# Patient Record
Sex: Female | Born: 1960 | Race: Black or African American | Hispanic: No | Marital: Married | Smoking: Never smoker
Health system: Southern US, Community
[De-identification: ages and names within clinical notes are randomized; demographics above are authoritative.]

## PROBLEM LIST (undated history)

## (undated) DIAGNOSIS — R1314 Dysphagia, pharyngoesophageal phase: Secondary | ICD-10-CM

## (undated) DIAGNOSIS — Z1211 Encounter for screening for malignant neoplasm of colon: Secondary | ICD-10-CM

## (undated) DIAGNOSIS — K639 Disease of intestine, unspecified: Secondary | ICD-10-CM

## (undated) DIAGNOSIS — K649 Unspecified hemorrhoids: Secondary | ICD-10-CM

## (undated) DIAGNOSIS — Z1379 Encounter for other screening for genetic and chromosomal anomalies: Secondary | ICD-10-CM

## (undated) DIAGNOSIS — Z1239 Encounter for other screening for malignant neoplasm of breast: Secondary | ICD-10-CM

## (undated) DIAGNOSIS — Z85038 Personal history of other malignant neoplasm of large intestine: Secondary | ICD-10-CM

## (undated) HISTORY — DX: Dysphagia, pharyngoesophageal phase: R13.14

## (undated) HISTORY — PX: COLONOSCOPY: SHX174

## (undated) HISTORY — DX: Personal history of other malignant neoplasm of large intestine: Z85.038

## (undated) HISTORY — DX: Disease of intestine, unspecified: K63.9

## (undated) HISTORY — DX: Unspecified hemorrhoids: K64.9

## (undated) HISTORY — DX: Encounter for other screening for genetic and chromosomal anomalies: Z13.79

## (undated) HISTORY — DX: Encounter for other screening for malignant neoplasm of breast: Z12.39

## (undated) HISTORY — DX: Encounter for screening for malignant neoplasm of colon: Z12.11

---

## 2003-04-27 DIAGNOSIS — K649 Unspecified hemorrhoids: Secondary | ICD-10-CM

## 2003-04-27 DIAGNOSIS — Z85038 Personal history of other malignant neoplasm of large intestine: Secondary | ICD-10-CM

## 2003-04-27 HISTORY — DX: Unspecified hemorrhoids: K64.9

## 2003-04-27 HISTORY — DX: Personal history of other malignant neoplasm of large intestine: Z85.038

## 2003-04-27 HISTORY — PX: COLON SURGERY: SHX602

## 2003-11-25 DIAGNOSIS — K639 Disease of intestine, unspecified: Secondary | ICD-10-CM

## 2003-11-25 HISTORY — DX: Disease of intestine, unspecified: K63.9

## 2004-02-11 ENCOUNTER — Ambulatory Visit: Payer: Self-pay

## 2005-04-13 ENCOUNTER — Ambulatory Visit: Payer: Self-pay

## 2005-11-16 ENCOUNTER — Ambulatory Visit: Payer: Self-pay | Admitting: Family Medicine

## 2006-05-03 ENCOUNTER — Ambulatory Visit: Payer: Self-pay

## 2006-05-05 ENCOUNTER — Ambulatory Visit: Payer: Self-pay

## 2006-12-08 ENCOUNTER — Ambulatory Visit: Payer: Self-pay | Admitting: General Surgery

## 2007-05-08 ENCOUNTER — Ambulatory Visit: Payer: Self-pay

## 2007-09-20 ENCOUNTER — Ambulatory Visit: Payer: Self-pay | Admitting: Family Medicine

## 2007-09-26 ENCOUNTER — Ambulatory Visit: Payer: Self-pay | Admitting: Family Medicine

## 2008-05-08 ENCOUNTER — Ambulatory Visit: Payer: Self-pay

## 2009-05-13 ENCOUNTER — Ambulatory Visit: Payer: Self-pay

## 2010-06-04 ENCOUNTER — Ambulatory Visit: Payer: Self-pay

## 2011-01-22 ENCOUNTER — Ambulatory Visit: Payer: Self-pay | Admitting: General Surgery

## 2011-01-25 LAB — PATHOLOGY REPORT

## 2011-04-27 DIAGNOSIS — R1314 Dysphagia, pharyngoesophageal phase: Secondary | ICD-10-CM

## 2011-04-27 HISTORY — DX: Dysphagia, pharyngoesophageal phase: R13.14

## 2011-07-07 ENCOUNTER — Ambulatory Visit: Payer: Self-pay

## 2012-02-28 ENCOUNTER — Ambulatory Visit: Payer: Self-pay | Admitting: General Surgery

## 2012-03-08 DIAGNOSIS — Z1239 Encounter for other screening for malignant neoplasm of breast: Secondary | ICD-10-CM

## 2012-03-08 DIAGNOSIS — Z1211 Encounter for screening for malignant neoplasm of colon: Secondary | ICD-10-CM

## 2012-03-08 HISTORY — DX: Encounter for screening for malignant neoplasm of colon: Z12.11

## 2012-03-08 HISTORY — DX: Encounter for other screening for malignant neoplasm of breast: Z12.39

## 2012-05-25 ENCOUNTER — Encounter: Payer: Self-pay | Admitting: *Deleted

## 2013-05-10 ENCOUNTER — Other Ambulatory Visit: Payer: Self-pay

## 2013-05-10 DIAGNOSIS — Z85038 Personal history of other malignant neoplasm of large intestine: Secondary | ICD-10-CM

## 2013-05-15 LAB — CEA: CEA: 3.1 ng/mL (ref 0.0–4.7)

## 2013-05-16 ENCOUNTER — Telehealth: Payer: Self-pay

## 2013-05-16 NOTE — Telephone Encounter (Signed)
Notified patient as instructed, patient pleased. Discussed follow-up appointments, patient agrees  

## 2013-05-16 NOTE — Telephone Encounter (Signed)
MSG left to call back for lab results.

## 2013-05-16 NOTE — Telephone Encounter (Signed)
Message copied by Lesly Rubenstein on Wed May 16, 2013  8:15 AM ------      Message from: [place], Naranja W      Created: Tue May 15, 2013  8:11 AM       Please notify the patient that her recent CEA showed a minimal change (2.8-3.1) from past years. We'll plan on a repeat in 6 months when it is time for repeat colonoscopy.       ----- Message -----         From: [facility] Lab Results In Interface         Sent: 05/15/2013   5:44 AM           To: Robert Bellow, MD                   ------

## 2013-06-12 ENCOUNTER — Ambulatory Visit: Payer: Self-pay | Admitting: General Surgery

## 2013-06-14 ENCOUNTER — Encounter: Payer: Self-pay | Admitting: General Surgery

## 2013-06-21 ENCOUNTER — Ambulatory Visit: Payer: Self-pay | Admitting: General Surgery

## 2013-07-04 ENCOUNTER — Encounter: Payer: Self-pay | Admitting: General Surgery

## 2013-07-04 ENCOUNTER — Ambulatory Visit (INDEPENDENT_AMBULATORY_CARE_PROVIDER_SITE_OTHER): Payer: BC Managed Care – PPO | Admitting: General Surgery

## 2013-07-04 VITALS — BP 148/82 | HR 70 | Resp 12 | Ht 65.0 in | Wt 149.0 lb

## 2013-07-04 DIAGNOSIS — Z85038 Personal history of other malignant neoplasm of large intestine: Secondary | ICD-10-CM

## 2013-07-04 NOTE — Patient Instructions (Signed)
The patient is aware to call back for any questions or concerns.  

## 2013-07-04 NOTE — Progress Notes (Signed)
Patient ID: Dawn Holt, female   DOB: 06-06-1960, 53 y.o.   MRN: [id number]  Chief Complaint  Patient presents with  . Follow-up    HPI Dawn Holt is a 53 y.o. female.  Here today for follow up colon cancer. Denies any gastrointestinal issues.  CEA was done in January 2015. No trouble swallowing at this time, she takes the omeprazole as needed, rarely twice a week a at this time. Bowels generally move daily.  HPI  Past Medical History  Diagnosis Date  . Hemorrhoids 2005  . Dysphagia, pharyngoesophageal phase 2013  . Breast screening, unspecified 03/08/2012  . Special screening for malignant neoplasms, colon 03/08/2012  . Personal history of malignant neoplasm of large intestine 2005  . Bowel trouble 11/2003    Past Surgical History  Procedure Laterality Date  . Colon surgery  2005    colon resection  . Colonoscopy  2005,2008,2012    Family History  Problem Relation Age of Onset  . Colon polyps Mother   . Colon polyps Maternal Aunt     with cancer    Social History History  Substance Use Topics  . Smoking status: Never Smoker   . Smokeless tobacco: Never Used  . Alcohol Use: Yes     Comment: occasionally    No Known Allergies  Current Outpatient Prescriptions  Medication Sig Dispense Refill  . omeprazole (PRILOSEC) 20 MG capsule Take 20 mg by mouth as needed.      . zolpidem (AMBIEN) 10 MG tablet Take 10 mg by mouth at bedtime as needed.        No current facility-administered medications for this visit.    Review of Systems Review of Systems  Constitutional: Negative.   Respiratory: Negative.   Cardiovascular: Negative.     Blood pressure 148/82, pulse 70, resp. rate 12, height 5\' 5"  (1.651 m), weight 149 lb (67.586 kg).  Physical Exam Physical Exam  Constitutional: She is oriented to person, place, and time. She appears well-developed and well-nourished.  Neck: Neck supple.  Cardiovascular: Normal rate, regular rhythm and normal heart sounds.    Pulmonary/Chest: Effort normal and breath sounds normal.  Abdominal: Soft. Normal appearance. There is no tenderness. No hernia.  Lymphadenopathy:    She has no cervical adenopathy.  Neurological: She is alert and oriented to person, place, and time.  Skin: Skin is warm and dry.    Data Reviewed A recently completed CEA: 3.1 ng/milliliter. Previously 2.8 ng/milliliter.  Assessment    Minimal change in her CEA, similar to past episodes.     Plan    Repeat CEA in July 2015. Repeat colonoscopy 2017. Follow up in the office in one year.  As the patient has a family history of colonic polyps in her mother and colon cancer in her maternal and, we'll see if she is a candidate for genetic testing.       Robert Bellow 07/07/2013, 3:25 PM

## 2013-07-07 DIAGNOSIS — Z85038 Personal history of other malignant neoplasm of large intestine: Secondary | ICD-10-CM | POA: Insufficient documentation

## 2013-07-10 ENCOUNTER — Telehealth: Payer: Self-pay | Admitting: *Deleted

## 2013-07-10 NOTE — Telephone Encounter (Signed)
Message copied by Carson Myrtle on Tue Jul 10, 2013 10:26 AM ------      Message from: Robert Bellow      Created: Sat Jul 07, 2013  3:27 PM       Contact Shara Blazing at the cancer center and see if this patient would be a candidate for genetic testing with her on personal history of colon cancer at age 53 as well as maternal history of colon polyps and a maternal aunt with colon cancer. Thank you ------

## 2013-07-12 ENCOUNTER — Encounter: Payer: Self-pay | Admitting: General Surgery

## 2013-07-12 NOTE — Telephone Encounter (Signed)
Patient qualifies for genetic testing based on criteria (Crystal at [facility]).

## 2013-07-19 ENCOUNTER — Encounter: Payer: Self-pay | Admitting: *Deleted

## 2013-07-19 ENCOUNTER — Telehealth: Payer: Self-pay | Admitting: *Deleted

## 2013-07-19 NOTE — Telephone Encounter (Signed)
Message copied by Carson Myrtle on Thu Jul 19, 2013  1:14 PM ------      Message from: [place], [place] W      Created: Thu Jul 19, 2013 12:40 PM       Let her get the [facility] assessment run. Thanks.      ----- Message -----         From: Carson Myrtle, RN         Sent: 07/12/2013   4:10 PM           To: Robert Bellow, MD            Desma Maxim sent me a message about genetic testing and Hansel Starling says she will quality for testing based on her age at [facility].      Thanks       ------

## 2013-07-19 NOTE — Telephone Encounter (Signed)
appt for genetic testing arranged

## 2013-07-26 ENCOUNTER — Ambulatory Visit: Payer: BC Managed Care – PPO | Admitting: *Deleted

## 2013-07-26 DIAGNOSIS — Z85038 Personal history of other malignant neoplasm of large intestine: Secondary | ICD-10-CM

## 2013-07-27 DIAGNOSIS — Z1379 Encounter for other screening for genetic and chromosomal anomalies: Secondary | ICD-10-CM

## 2013-07-27 HISTORY — DX: Encounter for other screening for genetic and chromosomal anomalies: Z13.79

## 2013-07-31 NOTE — Progress Notes (Signed)
Genetic testing completed. 

## 2013-08-01 ENCOUNTER — Telehealth: Payer: Self-pay | Admitting: *Deleted

## 2013-08-01 NOTE — Telephone Encounter (Signed)
Need information for genetic testing

## 2013-08-06 ENCOUNTER — Telehealth: Payer: Self-pay | Admitting: General Surgery

## 2013-08-06 NOTE — Telephone Encounter (Signed)
08-06-13 PT CALLED IN TODAY.SHE HAD RECEIVED MARSHA HATCH RN'SCALL FROM 08-01-13.SHE STATES SHE WANTS TO  CANCEL THE GENERIC TESTING AND JUST COME AT HER NEXT [facility] APPT.(IN RECALL)/MTH

## 2013-08-06 NOTE — Telephone Encounter (Signed)
PATTY WITH [facility] CALLED REGARDING PATIENT.NEEDS FOR YOU TO RETURN HER CALL @ [phone]/MTH °

## 2013-08-14 ENCOUNTER — Telehealth: Payer: Self-pay | Admitting: *Deleted

## 2013-08-14 NOTE — Telephone Encounter (Signed)
Message copied by Carson Myrtle on Tue Aug 14, 2013  4:22 PM ------      Message from: Robert Bellow      Created: Tue Aug 14, 2013  4:18 PM       Please notify the patient that her genetic testing came back all clear. Thanks. ------

## 2013-08-15 NOTE — Telephone Encounter (Signed)
Notified patient as instructed, patient pleased. Discussed follow-up appointments, patient agrees  

## 2013-09-05 ENCOUNTER — Encounter: Payer: Self-pay | Admitting: General Surgery

## 2013-10-25 ENCOUNTER — Other Ambulatory Visit: Payer: Self-pay

## 2013-10-25 DIAGNOSIS — Z85038 Personal history of other malignant neoplasm of large intestine: Secondary | ICD-10-CM

## 2013-11-03 ENCOUNTER — Telehealth: Payer: Self-pay | Admitting: General Surgery

## 2013-11-03 LAB — CEA: CEA: 2.8 ng/mL (ref 0.0–4.7)

## 2013-11-03 NOTE — Telephone Encounter (Signed)
CEA back to prior value of 2.8. We'll plan for followup exam in one year.

## 2014-02-25 ENCOUNTER — Encounter: Payer: Self-pay | Admitting: *Deleted

## 2014-06-06 ENCOUNTER — Other Ambulatory Visit: Payer: Self-pay

## 2014-06-06 DIAGNOSIS — Z85038 Personal history of other malignant neoplasm of large intestine: Secondary | ICD-10-CM

## 2014-06-18 ENCOUNTER — Telehealth: Payer: Self-pay | Admitting: *Deleted

## 2014-06-18 LAB — CEA: CEA: 2.8 ng/mL (ref 0.0–4.7)

## 2014-06-18 NOTE — Telephone Encounter (Signed)
Notified patient as instructed, patient pleased. Discussed follow-up appointments, patient agrees  

## 2014-06-18 NOTE — Telephone Encounter (Signed)
-----   Message from Robert Bellow, MD sent at 06/18/2014  3:41 PM EST ----- Regarding:   Please notify the patient recent CEA stable. F/U as planned in March.  ----- Message -----    From: [facility] Lab Results In Interface    Sent: 06/18/2014   7:41 AM      To: Robert Bellow, MD

## 2014-07-04 ENCOUNTER — Encounter: Payer: Self-pay | Admitting: General Surgery

## 2014-07-04 ENCOUNTER — Ambulatory Visit (INDEPENDENT_AMBULATORY_CARE_PROVIDER_SITE_OTHER): Payer: BC Managed Care – PPO | Admitting: General Surgery

## 2014-07-04 VITALS — BP 160/80 | HR 74 | Resp 12 | Ht 65.5 in | Wt 162.0 lb

## 2014-07-04 DIAGNOSIS — Z85038 Personal history of other malignant neoplasm of large intestine: Secondary | ICD-10-CM | POA: Diagnosis not present

## 2014-07-04 NOTE — Patient Instructions (Signed)
Patient to return in 1 year for follow up. The patient is aware to call back for any questions or concerns.

## 2014-07-04 NOTE — Progress Notes (Signed)
Patient ID: Dawn Holt, female   DOB: 05-02-1960, 54 y.o.   MRN: [id number]  Chief Complaint  Patient presents with  . Follow-up    colon cancer    HPI Dawn Holt is a 54 y.o. female who presents for a follow up for colon cancer. The patient is doing well. No new complaints at this time. She is due next year for a colonoscopy.    HPI  Past Medical History  Diagnosis Date  . Hemorrhoids 2005  . Dysphagia, pharyngoesophageal phase 2013  . Breast screening, unspecified 03/08/2012  . Special screening for malignant neoplasms, colon 03/08/2012  . Personal history of malignant neoplasm of large intestine 2005  . Bowel trouble 11/2003    Past Surgical History  Procedure Laterality Date  . Colon surgery  2005    colon resection  . Colonoscopy  2005,2008,2012    Family History  Problem Relation Age of Onset  . Colon polyps Mother   . Colon polyps Maternal Aunt 62    with cancer  . Colon polyps Maternal Aunt 65    with cancer  . Colon polyps Brother 55    Social History History  Substance Use Topics  . Smoking status: Never Smoker   . Smokeless tobacco: Never Used  . Alcohol Use: Yes     Comment: occasionally    No Known Allergies  Current Outpatient Prescriptions  Medication Sig Dispense Refill  . omeprazole (PRILOSEC) 20 MG capsule Take 20 mg by mouth as needed.    . zolpidem (AMBIEN) 10 MG tablet Take 10 mg by mouth at bedtime as needed.      No current facility-administered medications for this visit.    Review of Systems Review of Systems  Constitutional: Negative.   Respiratory: Negative.   Cardiovascular: Negative.   Gastrointestinal: Negative.     Blood pressure 160/80, pulse 74, resp. rate 12, height 5' 5.5" (1.664 m), weight 162 lb (73.483 kg).  Physical Exam Physical Exam  Constitutional: She is oriented to person, place, and time. She appears well-developed and well-nourished.  Cardiovascular: Normal rate, regular rhythm and normal  heart sounds.   No murmur heard. Pulmonary/Chest: Effort normal and breath sounds normal.  Neurological: She is alert and oriented to person, place, and time.  Skin: Skin is warm and dry.    Data Reviewed Recent CEA is stable at 2.8.  Assessment    Doing well 11 years status post colon resection.    Plan    We'll plan for a follow-up CEA in 2017. She will be due for a repeat colonoscopy at that time.     PCP:  Mamie Laurel 07/06/2014, 9:14 AM

## 2014-07-06 DIAGNOSIS — Z85038 Personal history of other malignant neoplasm of large intestine: Secondary | ICD-10-CM | POA: Insufficient documentation

## 2014-11-20 ENCOUNTER — Encounter: Payer: Self-pay | Admitting: Family Medicine

## 2014-11-20 ENCOUNTER — Encounter (INDEPENDENT_AMBULATORY_CARE_PROVIDER_SITE_OTHER): Payer: Self-pay

## 2014-11-20 ENCOUNTER — Ambulatory Visit (INDEPENDENT_AMBULATORY_CARE_PROVIDER_SITE_OTHER): Payer: BC Managed Care – PPO | Admitting: Family Medicine

## 2014-11-20 VITALS — BP 136/64 | HR 86 | Temp 98.4°F | Resp 14 | Ht 66.0 in | Wt 162.1 lb

## 2014-11-20 DIAGNOSIS — Z780 Asymptomatic menopausal state: Secondary | ICD-10-CM | POA: Diagnosis not present

## 2014-11-20 DIAGNOSIS — Z0000 Encounter for general adult medical examination without abnormal findings: Secondary | ICD-10-CM | POA: Diagnosis not present

## 2014-11-20 NOTE — Progress Notes (Signed)
Name: Dawn Holt   MRN: [id number]    DOB: 1961-01-12   Date:11/20/2014       Progress Note  Subjective  Chief Complaint  Chief Complaint  Patient presents with  . Annual Exam    HPI  54 year old   female here for annual H&P. No new problems have emerged.   Past Medical History  Diagnosis Date  . Hemorrhoids 2005  . Dysphagia, pharyngoesophageal phase 2013  . Breast screening, unspecified 03/08/2012  . Special screening for malignant neoplasms, colon 03/08/2012  . Personal history of malignant neoplasm of large intestine 2005  . Bowel trouble 11/2003    History  Substance Use Topics  . Smoking status: Never Smoker   . Smokeless tobacco: Never Used  . Alcohol Use: 0.0 oz/week    0 Standard drinks or equivalent per week     Comment: occasionally     Current outpatient prescriptions:  .  omeprazole (PRILOSEC) 20 MG capsule, Take 20 mg by mouth as needed., Disp: , Rfl:  .  zolpidem (AMBIEN) 10 MG tablet, Take 10 mg by mouth at bedtime as needed. , Disp: , Rfl:   No Known Allergies  Review of Systems  Constitutional: Positive for malaise/fatigue. Negative for fever, chills and weight loss.  HENT: Negative for congestion, hearing loss, sore throat and tinnitus.   Eyes: Negative for blurred vision, double vision and redness.  Respiratory: Negative for cough, hemoptysis and shortness of breath.   Cardiovascular: Negative for chest pain, palpitations, orthopnea, claudication and leg swelling.  Gastrointestinal: Negative for heartburn, nausea, vomiting, diarrhea, constipation and blood in stool.  Genitourinary: Negative for dysuria, urgency, frequency and hematuria.  Musculoskeletal: Negative for myalgias, back pain, joint pain, falls and neck pain.  Skin: Negative for itching.  Neurological: Negative for dizziness, tingling, tremors, focal weakness, seizures, loss of consciousness, weakness and headaches.  Endo/Heme/Allergies: Does not bruise/bleed easily.   Psychiatric/Behavioral: Negative for depression and substance abuse. The patient is not nervous/anxious and does not have insomnia.      Objective  Filed Vitals:   11/20/14 0916  BP: 136/64  Pulse: 86  Temp: 98.4 F (36.9 C)  TempSrc: Oral  Resp: 14  Height: 5\' 6"  (1.676 m)  Weight: 162 lb 1.6 oz (73.528 kg)  SpO2: 98%     Physical Exam  Constitutional: She is oriented to person, place, and time and well-developed, well-nourished, and in no distress.  HENT:  Head: Normocephalic.  Eyes: EOM are normal. Pupils are equal, round, and reactive to light.  Neck: Normal range of motion. No thyromegaly present.  Cardiovascular: Normal rate, regular rhythm and normal heart sounds.   No murmur heard. Pulmonary/Chest: Effort normal and breath sounds normal.  Abdominal: Soft. Bowel sounds are normal.  Genitourinary:    Members per  GYN   Musculoskeletal: Normal range of motion. She exhibits no edema.  Neurological: She is alert and oriented to person, place, and time. No cranial nerve deficit. Gait normal.  Skin: Skin is warm and dry. No rash noted.  Psychiatric: Memory and affect normal.      Assessment & Plan  1. Annual physical exam  - CBC w/Diff - COMPLETE METABOLIC PANEL WITH GFR - Lipid Profile - TSH - POC Hemoccult Bld/Stl (3-Cd Home Screen); Future - DG Bone Density; Future  2. Post-menopausal  - DG Bone Density; Future

## 2014-11-21 DIAGNOSIS — Z0000 Encounter for general adult medical examination without abnormal findings: Secondary | ICD-10-CM | POA: Insufficient documentation

## 2014-11-21 DIAGNOSIS — Z780 Asymptomatic menopausal state: Secondary | ICD-10-CM | POA: Insufficient documentation

## 2014-11-21 LAB — LIPID PANEL
CHOL/HDL RATIO: 2.9 ratio (ref 0.0–4.4)
CHOLESTEROL TOTAL: 239 mg/dL — AB (ref 100–199)
HDL: 82 mg/dL (ref >39)
LDL Calculated: 143 mg/dL — ABNORMAL HIGH (ref 0–99)
TRIGLYCERIDES: 72 mg/dL (ref 0–149)
VLDL CHOLESTEROL CAL: 14 mg/dL (ref 5–40)

## 2014-11-21 LAB — CBC WITH DIFFERENTIAL/PLATELET
BASOS ABS: 0 10*3/uL (ref 0.0–0.2)
BASOS: 0 %
EOS (ABSOLUTE): 0.1 10*3/uL (ref 0.0–0.4)
Eos: 2 %
HEMATOCRIT: 39.8 % (ref 34.0–46.6)
HEMOGLOBIN: 13.2 g/dL (ref 11.1–15.9)
Immature Grans (Abs): 0 10*3/uL (ref 0.0–0.1)
Immature Granulocytes: 0 %
Lymphocytes Absolute: 2.3 10*3/uL (ref 0.7–3.1)
Lymphs: 37 %
MCH: 31.4 pg (ref 26.6–33.0)
MCHC: 33.2 g/dL (ref 31.5–35.7)
MCV: 95 fL (ref 79–97)
MONOCYTES: 8 %
MONOS ABS: 0.5 10*3/uL (ref 0.1–0.9)
NEUTROS ABS: 3.3 10*3/uL (ref 1.4–7.0)
NEUTROS PCT: 53 %
PLATELETS: 305 10*3/uL (ref 150–379)
RBC: 4.21 x10E6/uL (ref 3.77–5.28)
RDW: 13.7 % (ref 12.3–15.4)
WBC: 6.1 10*3/uL (ref 3.4–10.8)

## 2014-11-21 LAB — COMPREHENSIVE METABOLIC PANEL
ALK PHOS: 92 IU/L (ref 39–117)
ALT: 15 IU/L (ref 0–32)
AST: 20 IU/L (ref 0–40)
Albumin/Globulin Ratio: 1.6 (ref 1.1–2.5)
Albumin: 4.7 g/dL (ref 3.5–5.5)
BUN / CREAT RATIO: 12 (ref 9–23)
BUN: 12 mg/dL (ref 6–24)
Bilirubin Total: 0.6 mg/dL (ref 0.0–1.2)
CHLORIDE: 102 mmol/L (ref 97–108)
CO2: 24 mmol/L (ref 18–29)
CREATININE: 1 mg/dL (ref 0.57–1.00)
Calcium: 9.8 mg/dL (ref 8.7–10.2)
GFR calc non Af Amer: 63 mL/min/{1.73_m2} (ref >59)
GFR, EST AFRICAN AMERICAN: 73 mL/min/{1.73_m2} (ref >59)
GLOBULIN, TOTAL: 3 g/dL (ref 1.5–4.5)
GLUCOSE: 107 mg/dL — AB (ref 65–99)
Potassium: 5.1 mmol/L (ref 3.5–5.2)
Sodium: 142 mmol/L (ref 134–144)
TOTAL PROTEIN: 7.7 g/dL (ref 6.0–8.5)

## 2014-11-21 LAB — TSH: TSH: 2.33 u[IU]/mL (ref 0.450–4.500)

## 2015-03-23 ENCOUNTER — Other Ambulatory Visit: Payer: Self-pay | Admitting: Family Medicine

## 2015-03-27 ENCOUNTER — Ambulatory Visit: Payer: BC Managed Care – PPO | Admitting: Family Medicine

## 2015-05-14 ENCOUNTER — Other Ambulatory Visit: Payer: Self-pay | Admitting: *Deleted

## 2015-05-14 ENCOUNTER — Encounter: Payer: Self-pay | Admitting: *Deleted

## 2015-05-14 DIAGNOSIS — Z85038 Personal history of other malignant neoplasm of large intestine: Secondary | ICD-10-CM

## 2015-05-14 NOTE — Addendum Note (Signed)
Addended by: Verlene Mayer A on: 05/14/2015 10:07 AM   Modules accepted: Orders

## 2015-05-21 ENCOUNTER — Telehealth: Payer: Self-pay

## 2015-05-21 LAB — CEA: CEA: 2.9 ng/mL (ref 0.0–4.7)

## 2015-05-21 NOTE — Telephone Encounter (Signed)
Notified patient as instructed, patient pleased. Discussed follow-up appointments, patient agrees  

## 2015-05-21 NOTE — Telephone Encounter (Signed)
-----   Message from Robert Bellow, MD sent at 05/21/2015  7:44 AM EST ----- Please notify the patient her lab work is fine. Follow-up as previously scheduled. ----- Message -----    From: [facility] Lab Results In Interface    Sent: 05/21/2015   5:37 AM      To: Robert Bellow, MD

## 2015-07-08 ENCOUNTER — Ambulatory Visit: Payer: Self-pay | Admitting: General Surgery

## 2015-07-17 ENCOUNTER — Ambulatory Visit (INDEPENDENT_AMBULATORY_CARE_PROVIDER_SITE_OTHER): Payer: BC Managed Care – PPO | Admitting: General Surgery

## 2015-07-17 ENCOUNTER — Encounter: Payer: Self-pay | Admitting: General Surgery

## 2015-07-17 VITALS — BP 178/88 | HR 76 | Resp 14 | Ht 66.0 in | Wt 164.0 lb

## 2015-07-17 DIAGNOSIS — Z85038 Personal history of other malignant neoplasm of large intestine: Secondary | ICD-10-CM | POA: Diagnosis not present

## 2015-07-17 DIAGNOSIS — G479 Sleep disorder, unspecified: Secondary | ICD-10-CM

## 2015-07-17 MED ORDER — ZOLPIDEM TARTRATE 10 MG PO TABS
10.0000 mg | ORAL_TABLET | Freq: Every evening | ORAL | Status: DC | PRN
Start: 2015-07-17 — End: 2015-11-05

## 2015-07-17 NOTE — Progress Notes (Signed)
Patient ID: Dawn Holt, female   DOB: [birth year], 55 y.o.   MRN: [id number]  Chief Complaint  Patient presents with  . Follow-up    colon cancer    HPI Dawn Holt is a 55 y.o. female here today for her follow up colon cancer. Patient state she is doing well.  HPI  Past Medical History  Diagnosis Date  . Hemorrhoids 2005  . Dysphagia, pharyngoesophageal phase 2013  . Breast screening, unspecified 03/08/2012  . Special screening for malignant neoplasms, colon 03/08/2012  . Personal history of malignant neoplasm of large intestine 2005  . Bowel trouble 11/2003    Past Surgical History  Procedure Laterality Date  . Colon surgery  2005    colon resection  . Colonoscopy  2005,2008,2012    Family History  Problem Relation Age of Onset  . Colon polyps Mother   . Colon polyps Maternal Aunt 62    with cancer  . Colon polyps Maternal Aunt 65    with cancer  . Colon polyps Brother 48    Social History Social History  Substance Use Topics  . Smoking status: Never Smoker   . Smokeless tobacco: Never Used  . Alcohol Use: 0.0 oz/week    0 Standard drinks or equivalent per week     Comment: occasionally    No Known Allergies  Current Outpatient Prescriptions  Medication Sig Dispense Refill  . omeprazole (PRILOSEC) 20 MG capsule Take 20 mg by mouth as needed.    . zolpidem (AMBIEN) 10 MG tablet Take 1 tablet (10 mg total) by mouth at bedtime as needed. 30 tablet 1   No current facility-administered medications for this visit.    Review of Systems Review of Systems  Constitutional: Negative.   Respiratory: Negative.   Cardiovascular: Negative.   Gastrointestinal: Negative.     Blood pressure 178/88, pulse 76, resp. rate 14, height 5\' 6"  (1.676 m), weight 164 lb (74.39 kg).  Physical Exam Physical Exam  Constitutional: She is oriented to person, place, and time. She appears well-developed and well-nourished.  Eyes: Conjunctivae are normal. No scleral  icterus.  Neck: Neck supple.  Cardiovascular: Normal rate, regular rhythm and normal heart sounds.   Pulmonary/Chest: Effort normal and breath sounds normal.  Abdominal: Bowel sounds are normal. There is no tenderness.  Lymphadenopathy:    She has no cervical adenopathy.  Neurological: She is alert and oriented to person, place, and time.  Skin: Skin is warm and dry.    Data Reviewed 01/22/2011 colonoscopy showed a 7 mm tubular adenoma in the cecum.  Assessment    Doing well now 12 years status post resection of the distal transverse colon.    Plan         Colonoscopy with possible biopsy/polypectomy prn: Information regarding the procedure, including its potential risks and complications (including but not limited to perforation of the bowel, which may require emergency surgery to repair, and bleeding) was verbally given to the patient. Educational information regarding lower intestinal endoscopy was given to the patient. Written instructions for how to complete the bowel prep using Miralax were provided. The importance of drinking ample fluids to avoid dehydration as a result of the prep emphasized.  Patient will be contacted when September 2017 schedule is available to arrange a date for colonoscopy. Miralax prescription will be sent in once date arranged. Pre-op visit will not be required. History and physical will be updated the day of colonoscopy.   The patient has had intermittent difficulties  with sleep and had been provided with small quantities of Ambien by her PCP. She has had difficulty following up as his medical health has interfered with his office hours. For that reason a small prescription of Ambien was provided to the patient.  Return to the office in one year for follow up with a CEA prior.  PCP:  Rutherford Nail  This information has been scribed by Gaspar Cola CMA.    Robert Bellow 07/18/2015, 2:12 PM

## 2015-07-17 NOTE — Patient Instructions (Signed)
Colonoscopy A colonoscopy is an exam to look at the entire large intestine (colon). This exam can help find problems such as tumors, polyps, inflammation, and areas of bleeding. The exam takes about 1 hour.  LET YOUR HEALTH CARE PROVIDER KNOW ABOUT:   Any allergies you have.  All medicines you are taking, including vitamins, herbs, eye drops, creams, and over-the-counter medicines.  Previous problems you or members of your family have had with the use of anesthetics.  Any blood disorders you have.  Previous surgeries you have had.  Medical conditions you have. RISKS AND COMPLICATIONS  Generally, this is a safe procedure. However, as with any procedure, complications can occur. Possible complications include:  Bleeding.  Tearing or rupture of the colon wall.  Reaction to medicines given during the exam.  Infection (rare). BEFORE THE PROCEDURE   Ask your health care provider about changing or stopping your regular medicines.  You may be prescribed an oral bowel prep. This involves drinking a large amount of medicated liquid, starting the day before your procedure. The liquid will cause you to have multiple loose stools until your stool is almost clear or light green. This cleans out your colon in preparation for the procedure.  Do not eat or drink anything else once you have started the bowel prep, unless your health care provider tells you it is safe to do so.  Arrange for someone to drive you home after the procedure. PROCEDURE   You will be given medicine to help you relax (sedative).  You will lie on your side with your knees bent.  A long, flexible tube with a light and camera on the end (colonoscope) will be inserted through the rectum and into the colon. The camera sends video back to a computer screen as it moves through the colon. The colonoscope also releases carbon dioxide gas to inflate the colon. This helps your health care provider see the area better.  During  the exam, your health care provider may take a small tissue sample (biopsy) to be examined under a microscope if any abnormalities are found.  The exam is finished when the entire colon has been viewed. AFTER THE PROCEDURE   Do not drive for 24 hours after the exam.  You may have a small amount of blood in your stool.  You may pass moderate amounts of gas and have mild abdominal cramping or bloating. This is caused by the gas used to inflate your colon during the exam.  Ask when your test results will be ready and how you will get your results. Make sure you get your test results.   This information is not intended to replace advice given to you by your health care provider. Make sure you discuss any questions you have with your health care provider.   Document Released: 04/09/2000 Document Revised: 01/31/2013 Document Reviewed: 12/18/2012 Elsevier Interactive Patient Education 2016 Elsevier Inc.  

## 2015-07-18 DIAGNOSIS — G479 Sleep disorder, unspecified: Secondary | ICD-10-CM

## 2015-07-18 HISTORY — DX: Sleep disorder, unspecified: G47.9

## 2015-08-29 LAB — HM PAP SMEAR: HM Pap smear: NORMAL

## 2015-09-19 ENCOUNTER — Encounter: Payer: Self-pay | Admitting: Family Medicine

## 2015-09-23 ENCOUNTER — Telehealth: Payer: Self-pay | Admitting: *Deleted

## 2015-09-23 MED ORDER — POLYETHYLENE GLYCOL 3350 17 GM/SCOOP PO POWD
1.0000 | Freq: Once | ORAL | Status: DC
Start: 2015-09-23 — End: 2015-11-05

## 2015-09-23 NOTE — Telephone Encounter (Signed)
Patient called and wanted to know when she can schedule colonoscopy.

## 2015-09-23 NOTE — Telephone Encounter (Signed)
Patient called and wanted to schedule her colonoscopy early. The patient is scheduled for a Colonoscopy at [facility] on 11/05/15. They are aware to call the day before to get their arrival time. Miralax prescription has been sent into the patient's pharmacy. The patient is aware of date and instructions.

## 2015-10-22 ENCOUNTER — Other Ambulatory Visit: Payer: Self-pay | Admitting: General Surgery

## 2015-10-22 DIAGNOSIS — Z85038 Personal history of other malignant neoplasm of large intestine: Secondary | ICD-10-CM

## 2015-11-03 ENCOUNTER — Telehealth: Payer: Self-pay | Admitting: *Deleted

## 2015-11-03 NOTE — Telephone Encounter (Signed)
Patient contacted today and confirms no medication changes since last office visit. Also, reports that she has Miralax prescription.  We will proceed with colonoscopy as scheduled for 11-05-15 at [facility].   This patient was instructed to call the office should she have further questions.

## 2015-11-05 ENCOUNTER — Ambulatory Visit: Payer: BC Managed Care – PPO | Admitting: Certified Registered"

## 2015-11-05 ENCOUNTER — Encounter: Payer: Self-pay | Admitting: *Deleted

## 2015-11-05 ENCOUNTER — Encounter: Payer: Self-pay | Admitting: Family Medicine

## 2015-11-05 ENCOUNTER — Encounter
Admission: RE | Disposition: A | Discharge status: Home / Self Care | Payer: Self-pay | Source: Ambulatory Visit | Attending: General Surgery

## 2015-11-05 ENCOUNTER — Ambulatory Visit (INDEPENDENT_AMBULATORY_CARE_PROVIDER_SITE_OTHER): Payer: BC Managed Care – PPO | Admitting: Family Medicine

## 2015-11-05 ENCOUNTER — Ambulatory Visit
Admission: RE | Admit: 2015-11-05 | Discharge: 2015-11-05 | Disposition: A | Discharge status: Home / Self Care | Payer: BC Managed Care – PPO | Source: Ambulatory Visit | Attending: General Surgery | Admitting: General Surgery

## 2015-11-05 VITALS — BP 157/80 | HR 67 | Temp 98.5°F | Resp 15 | Ht 66.0 in | Wt 158.9 lb

## 2015-11-05 DIAGNOSIS — R12 Heartburn: Secondary | ICD-10-CM

## 2015-11-05 DIAGNOSIS — Z9049 Acquired absence of other specified parts of digestive tract: Secondary | ICD-10-CM | POA: Diagnosis not present

## 2015-11-05 DIAGNOSIS — D125 Benign neoplasm of sigmoid colon: Secondary | ICD-10-CM | POA: Insufficient documentation

## 2015-11-05 DIAGNOSIS — Z85038 Personal history of other malignant neoplasm of large intestine: Secondary | ICD-10-CM | POA: Insufficient documentation

## 2015-11-05 DIAGNOSIS — Z9889 Other specified postprocedural states: Secondary | ICD-10-CM | POA: Diagnosis not present

## 2015-11-05 DIAGNOSIS — Z1211 Encounter for screening for malignant neoplasm of colon: Secondary | ICD-10-CM | POA: Diagnosis not present

## 2015-11-05 DIAGNOSIS — G479 Sleep disorder, unspecified: Secondary | ICD-10-CM

## 2015-11-05 DIAGNOSIS — Z79899 Other long term (current) drug therapy: Secondary | ICD-10-CM | POA: Insufficient documentation

## 2015-11-05 HISTORY — DX: Heartburn: R12

## 2015-11-05 HISTORY — PX: COLONOSCOPY WITH PROPOFOL: SHX5780

## 2015-11-05 SURGERY — COLONOSCOPY WITH PROPOFOL
Anesthesia: General

## 2015-11-05 MED ORDER — PROPOFOL 10 MG/ML IV BOLUS
INTRAVENOUS | Status: DC | PRN
  Administered 2015-11-05: 60 mg via INTRAVENOUS

## 2015-11-05 MED ORDER — OMEPRAZOLE 20 MG PO CPDR: 20.0000 mg | DELAYED_RELEASE_CAPSULE | Freq: Every day | ORAL | Status: DC

## 2015-11-05 MED ORDER — LIDOCAINE HCL (CARDIAC) 20 MG/ML IV SOLN
INTRAVENOUS | Status: DC | PRN
  Administered 2015-11-05: 60 mg via INTRAVENOUS

## 2015-11-05 MED ORDER — ZOLPIDEM TARTRATE 5 MG PO TABS: 5.0000 mg | ORAL_TABLET | Freq: Every evening | ORAL | Status: DC | PRN

## 2015-11-05 MED ORDER — SODIUM CHLORIDE 0.9 % IV SOLN
INTRAVENOUS | Status: DC
  Administered 2015-11-05 (×2): via INTRAVENOUS

## 2015-11-05 MED ORDER — PROPOFOL 500 MG/50ML IV EMUL
INTRAVENOUS | Status: DC | PRN
  Administered 2015-11-05: 150 ug/kg/min via INTRAVENOUS

## 2015-11-05 NOTE — Op Note (Signed)
[facility] Gastroenterology Patient Name: Dawn Holt Procedure Date: 11/05/2015 9:39 AM MRN: [id number] Account #: [id number] Date of Birth: March 17, 1961 Admit Type: Outpatient Age: 55 Room: [facility] ENDO ROOM 1 Gender: Female Note Status: Finalized Procedure:            Colonoscopy Indications:          High risk colon cancer surveillance: Personal history                        of colon cancer Providers:            Robert Bellow, MD Referring MD:         Ashok Norris, MD (Referring MD) Medicines:            Monitored Anesthesia Care Complications:        No immediate complications. Procedure:            Pre-Anesthesia Assessment:                       - Prior to the procedure, a History and Physical was                        performed, and patient medications, allergies and                        sensitivities were reviewed. The patient's tolerance of                        previous anesthesia was reviewed.                       - The risks and benefits of the procedure and the                        sedation options and risks were discussed with the                        patient. All questions were answered and informed                        consent was obtained.                       After obtaining informed consent, the colonoscope was                        passed under direct vision. Throughout the procedure,                        the patient's blood pressure, pulse, and oxygen                        saturations were monitored continuously. The                        Colonoscope was introduced through the anus and                        advanced to the the cecum, identified by appendiceal  orifice and ileocecal valve. The colonoscopy was                        performed without difficulty. The patient tolerated the                        procedure well. The quality of the bowel preparation                        was  excellent. Findings:      A 8 mm polyp was found in the sigmoid colon. The polyp was sessile. The       polyp was removed with a hot snare. Resection and retrieval were       complete.      The retroflexed view of the distal rectum and anal verge was normal and       showed no anal or rectal abnormalities. Impression:           - One 8 mm polyp in the sigmoid colon, removed with a                        hot snare. Resected and retrieved.                       - The distal rectum and anal verge are normal on                        retroflexion view. Recommendation:       - Telephone endoscopist for pathology results in 1 week. Procedure Code(s):    --- Professional ---                       [phone], Colonoscopy, flexible; with removal of tumor(s),                        polyp(s), or other lesion(s) by snare technique Diagnosis Code(s):    --- Professional ---                       [id number], Personal history of other malignant neoplasm                        of large intestine                       D12.5, Benign neoplasm of sigmoid colon CPT copyright 2016 American [employer] Association. All rights reserved. The codes documented in this report are preliminary and upon coder review may  be revised to meet current compliance requirements. Robert Bellow, MD 11/05/2015 10:02:26 AM This report has been signed electronically. Number of Addenda: 0 Note Initiated On: 11/05/2015 9:39 AM Scope Withdrawal Time: 0 hours 6 minutes 11 seconds  Total Procedure Duration: 0 hours 13 minutes 19 seconds       [facility]

## 2015-11-05 NOTE — Anesthesia Preprocedure Evaluation (Signed)
Anesthesia Evaluation  Patient identified by MRN, date of birth, ID band Patient awake    Reviewed: Allergy & Precautions, H&P , NPO status , Patient's Chart, lab work & pertinent test results, reviewed documented beta blocker date and time   History of Anesthesia Complications Negative for: history of anesthetic complications  Airway Mallampati: III  TM Distance: >3 FB Neck ROM: full    Dental no notable dental hx. (+) Teeth Intact   Pulmonary neg pulmonary ROS,    Pulmonary exam normal breath sounds clear to auscultation       Cardiovascular Exercise Tolerance: Good negative cardio ROS Normal cardiovascular exam Rhythm:regular Rate:Normal     Neuro/Psych negative neurological ROS  negative psych ROS   GI/Hepatic Neg liver ROS, GERD  ,  Endo/Other  negative endocrine ROS  Renal/GU negative Renal ROS  negative genitourinary   Musculoskeletal   Abdominal   Peds  Hematology negative hematology ROS (+)   Anesthesia Other Findings Past Medical History:   Hemorrhoids                                     2005         Dysphagia, pharyngoesophageal phase             2013         Breast screening, unspecified                   03/08/2012   Special screening for malignant neoplasms, col* 03/08/2012   Personal history of malignant neoplasm of larg* 2005         Bowel trouble                                   11/2003      Reproductive/Obstetrics negative OB ROS                             Anesthesia Physical Anesthesia Plan  ASA: I  Anesthesia Plan: General   Post-op Pain Management:    Induction:   Airway Management Planned:   Additional Equipment:   Intra-op Plan:   Post-operative Plan:   Informed Consent: I have reviewed the patients History and Physical, chart, labs and discussed the procedure including the risks, benefits and alternatives for the proposed anesthesia with the  patient or authorized representative who has indicated his/her understanding and acceptance.   Dental Advisory Given  Plan Discussed with: Anesthesiologist, CRNA and Surgeon  Anesthesia Plan Comments:         Anesthesia Quick Evaluation

## 2015-11-05 NOTE — Progress Notes (Signed)
Name: Dawn Holt   MRN: [id number]    DOB: 1960-11-08   Date:11/05/2015       Progress Note  Subjective  Chief Complaint  Chief Complaint  Patient presents with  . Medication Refill  This patient is usually followed by Dr. Rutherford Nail, new to me  HPI  Insomnia: Typical bedtime 9PM, trouble falling asleep, sometimes wakes up frequently during the night. No history of anxiety or depression. Takes Zolpidem 5mg  at bedtime when needed. This helps her achieve a good night's sleep.   Heartburn and Reflux: On occasion, experiences pressure and burning in her stomach, sometimes experiences reflux. Worse with red wine and when she eats some salads. No dysphagia, odynophagia, nausea,  vomiting, or weight loss.   Past Medical History  Diagnosis Date  . Hemorrhoids 2005  . Dysphagia, pharyngoesophageal phase 2013  . Breast screening, unspecified 03/08/2012  . Special screening for malignant neoplasms, colon 03/08/2012  . Personal history of malignant neoplasm of large intestine 2005  . Bowel trouble 11/2003    Past Surgical History  Procedure Laterality Date  . Colon surgery  2005    colon resection  . Colonoscopy  2005,2008,2012    Family History  Problem Relation Age of Onset  . Colon polyps Mother   . Colon polyps Maternal Aunt 62    with cancer  . Colon polyps Maternal Aunt 65    with cancer  . Colon polyps Brother 42    Social History   Social History  . Marital Status: Married    Spouse Name: N/A  . Number of Children: N/A  . Years of Education: N/A   Occupational History  . Not on file.   Social History Main Topics  . Smoking status: Never Smoker   . Smokeless tobacco: Never Used  . Alcohol Use: 0.0 oz/week    0 Standard drinks or equivalent per week     Comment: occasionally  . Drug Use: No  . Sexual Activity: Yes   Other Topics Concern  . Not on file   Social History Narrative     Current outpatient prescriptions:  .  omeprazole (PRILOSEC) 20 MG  capsule, Take 20 mg by mouth as needed., Disp: , Rfl:  .  zolpidem (AMBIEN) 10 MG tablet, Take 1 tablet (10 mg total) by mouth at bedtime as needed., Disp: 30 tablet, Rfl: 1  No Known Allergies   ROS  Please see HPI.  Objective  Filed Vitals:   11/05/15 1546  BP: 157/80  Pulse: 67  Temp: 98.5 F (36.9 C)  TempSrc: Oral  Resp: 15  Height: 5\' 6"  (1.676 m)  Weight: 158 lb 14.4 oz (72.077 kg)  SpO2: 98%    Physical Exam  Constitutional: She is oriented to person, place, and time and well-developed, well-nourished, and in no distress.  HENT:  Head: Normocephalic and atraumatic.  Cardiovascular: Normal rate, regular rhythm and normal heart sounds.   No murmur heard. Pulmonary/Chest: Effort normal and breath sounds normal. No respiratory distress. She has no wheezes.  Abdominal: Soft. Bowel sounds are normal. There is no tenderness.  Neurological: She is alert and oriented to person, place, and time.  Psychiatric: Mood, memory, affect and judgment normal.  Nursing note and vitals reviewed.    Assessment & Plan  1. Heartburn Stable, taking PPI as needed - omeprazole (PRILOSEC) 20 MG capsule; Take 1 capsule (20 mg total) by mouth daily.  Dispense: 30 capsule; Refill: 2  2. Sleep difficulties Stable, taking zolpidem 5 mg  as needed. - zolpidem (AMBIEN) 5 MG tablet; Take 1 tablet (5 mg total) by mouth at bedtime as needed.  Dispense: 30 tablet; Refill: 2   Shakim Faith [facility] 11/05/2015 4:02 PM

## 2015-11-05 NOTE — H&P (Signed)
CARTNEY SCHIRMER [id number] 1960/07/04     HPI: 55 year old with previous colon resection for T3 carcinoma. Follow-up exam. Tolerated prep well. The change in health since last office visit in May 2017.  Prescriptions prior to admission  Medication Sig Dispense Refill Last Dose  . omeprazole (PRILOSEC) 20 MG capsule Take 20 mg by mouth as needed.   Past Month at Unknown time  . polyethylene glycol powder (GLYCOLAX/MIRALAX) powder Take 255 g by mouth once. At 5 pm mix entire bottle with 64 ounces of clear liquids and drink one cup every 10-15 minutes until gone. 255 g 0 11/04/2015 at Unknown time  . zolpidem (AMBIEN) 10 MG tablet Take 1 tablet (10 mg total) by mouth at bedtime as needed. 30 tablet 1 Past Week at Unknown time   No Known Allergies Past Medical History  Diagnosis Date  . Hemorrhoids 2005  . Dysphagia, pharyngoesophageal phase 2013  . Breast screening, unspecified 03/08/2012  . Special screening for malignant neoplasms, colon 03/08/2012  . Personal history of malignant neoplasm of large intestine 2005  . Bowel trouble 11/2003   Past Surgical History  Procedure Laterality Date  . Colon surgery  2005    colon resection  . Colonoscopy  2005,2008,2012   Social History   Social History  . Marital Status: Married    Spouse Name: N/A  . Number of Children: N/A  . Years of Education: N/A   Occupational History  . Not on file.   Social History Main Topics  . Smoking status: Never Smoker   . Smokeless tobacco: Never Used  . Alcohol Use: 0.0 oz/week    0 Standard drinks or equivalent per week     Comment: occasionally  . Drug Use: No  . Sexual Activity: Yes   Other Topics Concern  . Not on file   Social History Narrative   Social History   Social History Narrative     ROS: Negative.     PE: HEENT: Negative. Lungs: Clear. Cardio: RR.  Assessment/Plan:  Proceed with planned endoscopy.  Robert Bellow 11/05/2015

## 2015-11-05 NOTE — Transfer of Care (Signed)
Immediate Anesthesia Transfer of Care Note  Patient: Dawn Holt  Procedure(s) Performed: Procedure(s): COLONOSCOPY WITH PROPOFOL (N/A)  Patient Location: Endoscopy Unit  Anesthesia Type:General  Level of Consciousness: awake, alert , oriented and patient cooperative  Airway & Oxygen Therapy: Patient Spontanous Breathing and Patient connected to nasal cannula oxygen  Post-op Assessment: Report given to RN, Post -op Vital signs reviewed and stable and Patient moving all extremities X 4  Post vital signs: Reviewed and stable  Last Vitals:  Filed Vitals:   11/05/15 0904  BP: 184/86  Pulse: 66  Temp: 36.7 C  Resp: 12    Last Pain: There were no vitals filed for this visit.       Complications: No apparent anesthesia complications

## 2015-11-05 NOTE — Anesthesia Postprocedure Evaluation (Signed)
Anesthesia Post Note  Patient: Dawn Holt  Procedure(s) Performed: Procedure(s) (LRB): COLONOSCOPY WITH PROPOFOL (N/A)  Patient location during evaluation: Endoscopy Anesthesia Type: General Level of consciousness: awake and alert Pain management: pain level controlled Vital Signs Assessment: post-procedure vital signs reviewed and stable Respiratory status: spontaneous breathing, nonlabored ventilation, respiratory function stable and patient connected to nasal cannula oxygen Cardiovascular status: blood pressure returned to baseline and stable Postop Assessment: no signs of nausea or vomiting Anesthetic complications: no    Last Vitals:  Filed Vitals:   11/05/15 1023 11/05/15 1033  BP: 137/81 144/80  Pulse: 55 53  Temp:    Resp: 18 14    Last Pain: There were no vitals filed for this visit.               Martha Clan

## 2015-11-06 LAB — SURGICAL PATHOLOGY

## 2015-11-09 ENCOUNTER — Encounter: Payer: Self-pay | Admitting: General Surgery

## 2015-11-25 ENCOUNTER — Encounter: Payer: BC Managed Care – PPO | Admitting: Family Medicine

## 2015-12-01 ENCOUNTER — Encounter: Payer: BC Managed Care – PPO | Admitting: Family Medicine

## 2016-01-20 ENCOUNTER — Encounter: Payer: BC Managed Care – PPO | Admitting: Family Medicine

## 2016-03-03 ENCOUNTER — Ambulatory Visit (INDEPENDENT_AMBULATORY_CARE_PROVIDER_SITE_OTHER): Payer: BC Managed Care – PPO | Admitting: Family Medicine

## 2016-03-03 ENCOUNTER — Encounter: Payer: Self-pay | Admitting: Family Medicine

## 2016-03-03 VITALS — BP 124/76 | HR 95 | Temp 97.1°F | Resp 16 | Ht 66.0 in | Wt 152.2 lb

## 2016-03-03 DIAGNOSIS — G479 Sleep disorder, unspecified: Secondary | ICD-10-CM

## 2016-03-03 DIAGNOSIS — Z0000 Encounter for general adult medical examination without abnormal findings: Secondary | ICD-10-CM | POA: Diagnosis not present

## 2016-03-03 DIAGNOSIS — R12 Heartburn: Secondary | ICD-10-CM

## 2016-03-03 LAB — CBC WITH DIFFERENTIAL/PLATELET
BASOS ABS: 0 {cells}/uL (ref 0–200)
Basophils Relative: 0 %
EOS ABS: 122 {cells}/uL (ref 15–500)
EOS PCT: 2 %
HCT: 41.4 % (ref 35.0–45.0)
HEMOGLOBIN: 13.8 g/dL (ref 11.7–15.5)
Lymphocytes Relative: 39 %
Lymphs Abs: 2379 cells/uL (ref 850–3900)
MCH: 31.5 pg (ref 27.0–33.0)
MCHC: 33.3 g/dL (ref 32.0–36.0)
MCV: 94.5 fL (ref 80.0–100.0)
MONOS PCT: 7 %
MPV: 10.1 fL (ref 7.5–12.5)
Monocytes Absolute: 427 cells/uL (ref 200–950)
NEUTROS PCT: 52 %
Neutro Abs: 3172 cells/uL (ref 1500–7800)
PLATELETS: 343 10*3/uL (ref 140–400)
RBC: 4.38 MIL/uL (ref 3.80–5.10)
RDW: 13.9 % (ref 11.0–15.0)
WBC: 6.1 10*3/uL (ref 3.8–10.8)

## 2016-03-03 LAB — COMPLETE METABOLIC PANEL WITH GFR
ALBUMIN: 4.5 g/dL (ref 3.6–5.1)
ALK PHOS: 78 U/L (ref 33–130)
ALT: 11 U/L (ref 6–29)
AST: 20 U/L (ref 10–35)
BILIRUBIN TOTAL: 0.7 mg/dL (ref 0.2–1.2)
BUN: 14 mg/dL (ref 7–25)
CO2: 24 mmol/L (ref 20–31)
Calcium: 9.9 mg/dL (ref 8.6–10.4)
Chloride: 103 mmol/L (ref 98–110)
Creat: 1 mg/dL (ref 0.50–1.05)
GFR, EST AFRICAN AMERICAN: 72 mL/min (ref >=60)
GFR, EST NON AFRICAN AMERICAN: 63 mL/min (ref >=60)
GLUCOSE: 98 mg/dL (ref 65–99)
POTASSIUM: 4.6 mmol/L (ref 3.5–5.3)
SODIUM: 140 mmol/L (ref 135–146)
TOTAL PROTEIN: 7.6 g/dL (ref 6.1–8.1)

## 2016-03-03 LAB — LIPID PANEL
CHOLESTEROL: 232 mg/dL — AB (ref <200)
HDL: 79 mg/dL (ref >50)
LDL CALC: 139 mg/dL — AB
TRIGLYCERIDES: 69 mg/dL (ref <150)
Total CHOL/HDL Ratio: 2.9 Ratio (ref <5.0)
VLDL: 14 mg/dL (ref <30)

## 2016-03-03 LAB — TSH: TSH: 1.42 m[IU]/L

## 2016-03-03 MED ORDER — ZOLPIDEM TARTRATE 5 MG PO TABS: 5.0000 mg | ORAL_TABLET | Freq: Every evening | ORAL | 2 refills | Status: DC | PRN

## 2016-03-03 MED ORDER — OMEPRAZOLE 20 MG PO CPDR: 20.0000 mg | DELAYED_RELEASE_CAPSULE | Freq: Every day | ORAL | 0 refills | Status: DC

## 2016-03-03 NOTE — Progress Notes (Signed)
Name: Dawn Holt   MRN: [id number]    DOB: May 23, 1960   Date:03/03/2016       Progress Note  Subjective  Chief Complaint  Chief Complaint  Patient presents with  . Annual Exam    HPI  Pt. Presents for complete physical exam. She follows up with Gynecology for female exams and screenings. Colonoscopy was in current year by Dr. Bary Castilla.   Past Medical History:  Diagnosis Date  . Bowel trouble 11/2003  . Breast screening, unspecified 03/08/2012  . Dysphagia, pharyngoesophageal phase 2013  . Hemorrhoids 2005  . Personal history of malignant neoplasm of large intestine 2005  . Special screening for malignant neoplasms, colon 03/08/2012    Past Surgical History:  Procedure Laterality Date  . COLON SURGERY  2005   colon resection  . COLONOSCOPY  2005,2008,2012  . COLONOSCOPY WITH PROPOFOL N/A 11/05/2015   Procedure: COLONOSCOPY WITH PROPOFOL;  Surgeon: Robert Bellow, MD;  Location[facility] ENDOSCOPY;  Service: Endoscopy;  Laterality: N/A;    Family History  Problem Relation Age of Onset  . Colon polyps Mother   . Colon polyps Maternal Aunt 62    with cancer  . Colon polyps Brother 68  . Colon polyps Maternal Aunt 65    with cancer    Social History   Social History  . Marital status: Married    Spouse name: N/A  . Number of children: N/A  . Years of education: N/A   Occupational History  . Not on file.   Social History Main Topics  . Smoking status: Never Smoker  . Smokeless tobacco: Never Used  . Alcohol use 0.0 oz/week     Comment: occasionally  . Drug use: No  . Sexual activity: Yes   Other Topics Concern  . Not on file   Social History Narrative  . No narrative on file     Current Outpatient Prescriptions:  .  omeprazole (PRILOSEC) 20 MG capsule, Take 1 capsule (20 mg total) by mouth daily., Disp: 30 capsule, Rfl: 2 .  zolpidem (AMBIEN) 5 MG tablet, Take 1 tablet (5 mg total) by mouth at bedtime as needed., Disp: 30 tablet, Rfl: 2  No  Known Allergies   Review of Systems  Constitutional: Negative for chills, fever, malaise/fatigue and weight loss.  HENT: Negative for congestion and hearing loss.   Eyes: Negative for blurred vision and double vision.  Respiratory: Negative for cough and shortness of breath.   Cardiovascular: Negative for chest pain and leg swelling.  Gastrointestinal: Positive for heartburn (occasional heartburn). Negative for abdominal pain, blood in stool, constipation and diarrhea.  Genitourinary: Negative for hematuria.  Musculoskeletal: Negative for back pain and myalgias.  Skin: Negative for rash.  Psychiatric/Behavioral: Negative for depression and hallucinations. The patient has insomnia. The patient is not nervous/anxious.     Objective  Vitals:   03/03/16 0905  BP: 124/76  Pulse: 95  Resp: 16  Temp: 97.1 F (36.2 C)  TempSrc: Oral  SpO2: 98%  Weight: 152 lb 3.2 oz (69 kg)  Height: 5\' 6"  (1.676 m)    Physical Exam  Constitutional: She is oriented to person, place, and time and well-developed, well-nourished, and in no distress.  HENT:  Head: Normocephalic and atraumatic.  Eyes: Pupils are equal, round, and reactive to light.  Cardiovascular: Normal rate, regular rhythm and normal heart sounds.   No murmur heard. Pulmonary/Chest: Effort normal and breath sounds normal. She has no wheezes.  Abdominal: Soft. Bowel sounds are normal.  Neurological: She is alert and oriented to person, place, and time.  Skin: Skin is warm, dry and intact.  Psychiatric: Mood, memory, affect and judgment normal.  Nursing note and vitals reviewed.    Assessment & Plan 1. Well woman exam without gynecological exam Obtain age-appropriate laboratory screenings - Lipid Profile - CBC with Differential - COMPLETE METABOLIC PANEL WITH GFR - Vitamin D (25 hydroxy) - TSH  2. Sleeping difficulty Takes Ambien as needed. Refills provided - zolpidem (AMBIEN) 5 MG tablet; Take 1 tablet (5 mg total) by  mouth at bedtime as needed.  Dispense: 30 tablet; Refill: 2  3. Heartburn  - omeprazole (PRILOSEC) 20 MG capsule; Take 1 capsule (20 mg total) by mouth daily.  Dispense: 90 capsule; Refill: 0    Faatima Tench [facility]ad [facility] 03/03/2016 9:36 AM

## 2016-03-04 LAB — VITAMIN D 25 HYDROXY (VIT D DEFICIENCY, FRACTURES): VIT D 25 HYDROXY: 35 ng/mL (ref 30–100)

## 2016-05-26 ENCOUNTER — Other Ambulatory Visit: Payer: Self-pay

## 2016-05-26 DIAGNOSIS — Z85038 Personal history of other malignant neoplasm of large intestine: Secondary | ICD-10-CM

## 2016-06-19 LAB — CEA: CEA: 2.7 ng/mL (ref 0.0–4.7)

## 2016-06-23 ENCOUNTER — Telehealth: Payer: Self-pay | Admitting: *Deleted

## 2016-06-23 NOTE — Telephone Encounter (Signed)
CEA remains in normal range. F/U as scheduled.

## 2016-06-23 NOTE — Telephone Encounter (Signed)
Patient called wanting to know her lab results from 06/18/16

## 2016-06-24 NOTE — Telephone Encounter (Signed)
Notified patient as instructed, patient pleased. Discussed follow-up appointments, patient agrees  

## 2016-07-19 ENCOUNTER — Ambulatory Visit: Payer: Self-pay | Admitting: General Surgery

## 2016-08-11 ENCOUNTER — Other Ambulatory Visit: Payer: Self-pay | Admitting: Family Medicine

## 2016-08-11 DIAGNOSIS — G479 Sleep disorder, unspecified: Secondary | ICD-10-CM

## 2016-08-12 ENCOUNTER — Other Ambulatory Visit: Payer: Self-pay | Admitting: Family Medicine

## 2016-08-12 DIAGNOSIS — G479 Sleep disorder, unspecified: Secondary | ICD-10-CM

## 2016-08-13 NOTE — Telephone Encounter (Signed)
[facility] web site reviewed No early fills; no other prescribers [facility] approved in primary provider's absence

## 2016-08-26 ENCOUNTER — Encounter: Payer: Self-pay | Admitting: *Deleted

## 2016-08-31 ENCOUNTER — Ambulatory Visit: Payer: BC Managed Care – PPO | Admitting: Family Medicine

## 2016-09-17 ENCOUNTER — Ambulatory Visit (INDEPENDENT_AMBULATORY_CARE_PROVIDER_SITE_OTHER): Payer: BC Managed Care – PPO | Admitting: Family Medicine

## 2016-09-17 ENCOUNTER — Encounter: Payer: Self-pay | Admitting: Family Medicine

## 2016-09-17 DIAGNOSIS — G479 Sleep disorder, unspecified: Secondary | ICD-10-CM | POA: Diagnosis not present

## 2016-09-17 DIAGNOSIS — R12 Heartburn: Secondary | ICD-10-CM | POA: Diagnosis not present

## 2016-09-17 MED ORDER — ZOLPIDEM TARTRATE 5 MG PO TABS: ORAL_TABLET | ORAL | 2 refills | Status: DC

## 2016-09-17 MED ORDER — OMEPRAZOLE 20 MG PO CPDR: 20.0000 mg | DELAYED_RELEASE_CAPSULE | Freq: Every day | ORAL | 1 refills | Status: DC

## 2016-09-17 NOTE — Progress Notes (Signed)
Name: Dawn Holt   MRN: [id number]    DOB: June 28, 1960   Date:09/17/2016       Progress Note  Subjective  Chief Complaint  Chief Complaint  Patient presents with  . Follow-up    6 mo  . Medication Refill    Gastroesophageal Reflux  She reports no abdominal pain, no belching, no coughing, no dysphagia, no heartburn or no sore throat. This is a chronic problem. The problem has been unchanged. The symptoms are aggravated by certain foods (red wine and tomato paste). She has tried a PPI for the symptoms. Past procedures do not include an EGD.  Insomnia  Primary symptoms: difficulty falling asleep, premature morning awakening.  The onset quality is gradual. Past treatments include medication. Typical bedtime:  8-10 P.M. (9 PM).  How long after going to bed to you fall asleep: 30-60 minutes (with Ambien, she falls asleep within minutes).       Past Medical History:  Diagnosis Date  . Bowel trouble 11/2003  . Breast screening, unspecified 03/08/2012  . Dysphagia, pharyngoesophageal phase 2013  . Hemorrhoids 2005  . Personal history of malignant neoplasm of large intestine 2005  . Special screening for malignant neoplasms, colon 03/08/2012    Past Surgical History:  Procedure Laterality Date  . COLON SURGERY  2005   colon resection  . COLONOSCOPY  2005,2008,2012  . COLONOSCOPY WITH PROPOFOL N/A 11/05/2015   Procedure: COLONOSCOPY WITH PROPOFOL;  Surgeon: Robert Bellow, MD;  Location: [facility] ENDOSCOPY;  Service: Endoscopy;  Laterality: N/A;    Family History  Problem Relation Age of Onset  . Colon polyps Mother   . Colon polyps Maternal Aunt 62       with cancer  . Colon polyps Brother 53  . Colon polyps Maternal Aunt 65       with cancer    Social History   Social History  . Marital status: Married    Spouse name: N/A  . Number of children: N/A  . Years of education: N/A   Occupational History  . Not on file.   Social History Main Topics  . Smoking status:  Never Smoker  . Smokeless tobacco: Never Used  . Alcohol use 0.0 oz/week     Comment: occasionally  . Drug use: No  . Sexual activity: Yes   Other Topics Concern  . Not on file   Social History Narrative  . No narrative on file     Current Outpatient Prescriptions:  .  omeprazole (PRILOSEC) 20 MG capsule, Take 1 capsule (20 mg total) by mouth daily., Disp: 90 capsule, Rfl: 0 .  zolpidem (AMBIEN) 5 MG tablet, TAKE ONE TABLET BY MOUTH ONCE DAILY AT BEDTIME AS NEEDED, Disp: 5 tablet, Rfl: 0  No Known Allergies   Review of Systems  HENT: Negative for sore throat.   Respiratory: Negative for cough.   Gastrointestinal: Negative for abdominal pain, dysphagia and heartburn.  Psychiatric/Behavioral: The patient has insomnia.      Objective  Vitals:   09/17/16 0850  BP: 126/78  Pulse: 71  Resp: 16  Temp: 98.4 F (36.9 C)  TempSrc: Oral  SpO2: 98%  Weight: 156 lb 3.2 oz (70.9 kg)  Height: 5\' 5"  (1.651 m)    Physical Exam  Constitutional: She is oriented to person, place, and time and well-developed, well-nourished, and in no distress.  HENT:  Head: Normocephalic and atraumatic.  Cardiovascular: Normal rate, regular rhythm and normal heart sounds.   No murmur heard. Pulmonary/Chest:  Effort normal and breath sounds normal. She has no wheezes.  Abdominal: Soft. Bowel sounds are normal. There is no tenderness.  Neurological: She is alert and oriented to person, place, and time.  Psychiatric: Mood, memory, affect and judgment normal.  Nursing note and vitals reviewed.     Assessment & Plan  1. Heartburn Takes omeprazole as needed for heartburn, refills provided - omeprazole (PRILOSEC) 20 MG capsule; Take 1 capsule (20 mg total) by mouth daily.  Dispense: 90 capsule; Refill: 1  2. Sleeping difficulty  - zolpidem (AMBIEN) 5 MG tablet; TAKE ONE TABLET BY MOUTH ONCE DAILY AT BEDTIME AS NEEDED  Dispense: 30 tablet; Refill: 2   Dawn Holt [facility] 09/17/2016 8:58 AM

## 2016-09-27 ENCOUNTER — Encounter: Payer: Self-pay | Admitting: *Deleted

## 2016-09-30 ENCOUNTER — Ambulatory Visit (INDEPENDENT_AMBULATORY_CARE_PROVIDER_SITE_OTHER): Payer: BC Managed Care – PPO | Admitting: General Surgery

## 2016-09-30 ENCOUNTER — Encounter: Payer: Self-pay | Admitting: General Surgery

## 2016-09-30 VITALS — BP 152/82 | HR 74 | Resp 12 | Ht 66.0 in | Wt 158.0 lb

## 2016-09-30 DIAGNOSIS — Z85038 Personal history of other malignant neoplasm of large intestine: Secondary | ICD-10-CM | POA: Diagnosis not present

## 2016-09-30 DIAGNOSIS — Z1231 Encounter for screening mammogram for malignant neoplasm of breast: Secondary | ICD-10-CM

## 2016-09-30 NOTE — Progress Notes (Signed)
Patient ID: Dawn Holt, female   DOB: [birth year], 56 y.o.   MRN: [id number]  Chief Complaint  Patient presents with  . Colon Cancer    HPI Dawn Holt is a 56 y.o. female here today for her yearly follow up colon cancer. Patient states she is doing well. CEA done on 06/23/2016. Moving her bowels daily.  HPI  Past Medical History:  Diagnosis Date  . Bowel trouble 11/2003  . Breast screening, unspecified 03/08/2012  . Dysphagia, pharyngoesophageal phase 2013  . Hemorrhoids 2005  . Personal history of malignant neoplasm of large intestine 2005  . Special screening for malignant neoplasms, colon 03/08/2012    Past Surgical History:  Procedure Laterality Date  . COLON SURGERY  2005   colon resection  . COLONOSCOPY  2005,2008,2012  . COLONOSCOPY WITH PROPOFOL N/A 11/05/2015   Procedure: COLONOSCOPY WITH PROPOFOL;  Surgeon: Robert Bellow, MD;  Location: [facility] ENDOSCOPY;  Service: Endoscopy;  Laterality: N/A;    Family History  Problem Relation Age of Onset  . Colon polyps Mother   . Colon polyps Maternal Aunt 62       with cancer  . Colon polyps Brother 68  . Colon polyps Maternal Aunt 65       with cancer    Social History Social History  Substance Use Topics  . Smoking status: Never Smoker  . Smokeless tobacco: Never Used  . Alcohol use 0.0 oz/week     Comment: occasionally    No Known Allergies  Current Outpatient Prescriptions  Medication Sig Dispense Refill  . omeprazole (PRILOSEC) 20 MG capsule Take 1 capsule (20 mg total) by mouth daily. 90 capsule 1  . zolpidem (AMBIEN) 5 MG tablet TAKE ONE TABLET BY MOUTH ONCE DAILY AT BEDTIME AS NEEDED 30 tablet 2   No current facility-administered medications for this visit.     Review of Systems Review of Systems  Constitutional: Negative.   Respiratory: Negative.   Cardiovascular: Negative.   Gastrointestinal: Negative.     Blood pressure (!) 152/82, pulse 74, resp. rate 12, height 5\' 6"  (1.676 m),  weight 158 lb (71.7 kg).  Physical Exam Physical Exam  Constitutional: She is oriented to person, place, and time. She appears well-developed and well-nourished.  Eyes: Conjunctivae are normal. No scleral icterus.  Neck: Neck supple.  Cardiovascular: Normal rate, regular rhythm and normal heart sounds.   Pulmonary/Chest: Effort normal and breath sounds normal.  Abdominal: Soft. Bowel sounds are normal. There is no tenderness.  Neurological: She is alert and oriented to person, place, and time.  Skin: Skin is warm and dry.    Data Reviewed CEA obtained 06/18/2016 on changed to 2.7.  Assessment    Doing well without evidence of ongoing GI difficulties.  Need for screening mammograms and clinical breast exam, be arranged with her PCP.    Plan     W will confirm that the patient has been screened for Lynch syndrome.    Patient to return in one year colon cancer follow up . CEA done before office visit. . Patient to be scheduled for screening mammogram.   HPI, Physical Exam, Assessment and Plan have been scribed under the direction and in the presence of Hervey Ard, MD.  Gaspar Cola, CMA  I have completed the exam and reviewed the above documentation for accuracy and completeness.  I agree with the above.  Haematologist has been used and any errors in dictation or transcription are unintentional.  Hervey Ard, M.D.,  F.A.C.S.    Gaspar Cola 09/30/2016, 3:56 PM

## 2016-09-30 NOTE — Patient Instructions (Signed)
Patient to return in one year colon cancer follow up . CEA done before office visit.

## 2016-10-05 ENCOUNTER — Telehealth: Payer: Self-pay | Admitting: *Deleted

## 2016-10-05 ENCOUNTER — Encounter: Payer: Self-pay | Admitting: *Deleted

## 2016-10-05 NOTE — Telephone Encounter (Signed)
-----   Message from Robert Bellow, MD sent at 10/02/2016  7:07 AM EDT ----- Please contact the patient and determine if she has had genetic testing for Lynch syndrome. Thank you

## 2016-10-05 NOTE — Telephone Encounter (Signed)
I talked with the patient. Patient had genetic testing 07-27-13 (results scanned in media). I have added this to her history. Thanks.

## 2016-10-11 ENCOUNTER — Ambulatory Visit
Admission: RE | Admit: 2016-10-11 | Discharge: 2016-10-11 | Disposition: A | Discharge status: Home / Self Care | Payer: BC Managed Care – PPO | Source: Ambulatory Visit | Attending: General Surgery | Admitting: General Surgery

## 2016-10-11 DIAGNOSIS — Z1231 Encounter for screening mammogram for malignant neoplasm of breast: Secondary | ICD-10-CM

## 2016-10-12 ENCOUNTER — Inpatient Hospital Stay
Admission: RE | Admit: 2016-10-12 | Discharge: 2016-10-12 | Disposition: A | Discharge status: Home / Self Care | Payer: Self-pay | Source: Ambulatory Visit | Attending: *Deleted | Admitting: *Deleted

## 2016-10-12 ENCOUNTER — Other Ambulatory Visit: Payer: Self-pay | Admitting: *Deleted

## 2016-10-12 DIAGNOSIS — Z9289 Personal history of other medical treatment: Secondary | ICD-10-CM

## 2017-08-05 ENCOUNTER — Other Ambulatory Visit: Payer: Self-pay | Admitting: *Deleted

## 2017-08-05 ENCOUNTER — Encounter: Payer: Self-pay | Admitting: *Deleted

## 2017-08-05 DIAGNOSIS — Z85038 Personal history of other malignant neoplasm of large intestine: Secondary | ICD-10-CM

## 2017-09-08 ENCOUNTER — Other Ambulatory Visit: Payer: Self-pay | Admitting: General Surgery

## 2017-09-08 DIAGNOSIS — Z1231 Encounter for screening mammogram for malignant neoplasm of breast: Secondary | ICD-10-CM

## 2017-09-10 LAB — CEA: CEA: 2.9 ng/mL (ref 0.0–4.7)

## 2017-10-06 ENCOUNTER — Ambulatory Visit (INDEPENDENT_AMBULATORY_CARE_PROVIDER_SITE_OTHER): Payer: BC Managed Care – PPO | Admitting: General Surgery

## 2017-10-06 ENCOUNTER — Encounter: Payer: Self-pay | Admitting: General Surgery

## 2017-10-06 VITALS — BP 140/72 | HR 71 | Resp 13 | Ht 66.0 in | Wt 158.0 lb

## 2017-10-06 DIAGNOSIS — Z85038 Personal history of other malignant neoplasm of large intestine: Secondary | ICD-10-CM

## 2017-10-06 NOTE — Patient Instructions (Signed)
Return in one year colon cancer follow up. The patient is aware to call back for any questions or concerns.

## 2017-10-06 NOTE — Progress Notes (Signed)
Patient ID: Dawn Holt, female   DOB: 03-02-1961, 57 y.o.   MRN: [id number]  Chief Complaint  Patient presents with  . Colon Cancer    HPI Dawn Holt is a 57 y.o. female here today for her one year follow up colon cancer. CEA done. Patient states she has been doing well. Moves her bowels daily.  HPI  Past Medical History:  Diagnosis Date  . Bowel trouble 11/2003  . Breast screening, unspecified 03/08/2012  . Dysphagia, pharyngoesophageal phase 2013  . Genetic testing 07/27/2013   Myriad coloaris genetic testing -negative  . Hemorrhoids 2005  . Personal history of malignant neoplasm of large intestine 2005  . Special screening for malignant neoplasms, colon 03/08/2012    Past Surgical History:  Procedure Laterality Date  . COLON SURGERY  2005   colon resection  . COLONOSCOPY  2005,2008,2012  . COLONOSCOPY WITH PROPOFOL N/A 11/05/2015   Procedure: COLONOSCOPY WITH PROPOFOL;  Surgeon: Robert Bellow, MD;  Location: [facility] ENDOSCOPY;  Service: Endoscopy;  Laterality: N/A;    Family History  Problem Relation Age of Onset  . Colon polyps Mother   . Colon polyps Maternal Aunt 62       with cancer  . Colon polyps Brother 72  . Colon polyps Maternal Aunt 65       with cancer  . Breast cancer Cousin        pat cousin  . Breast cancer Maternal Aunt 85    Social History Social History   Tobacco Use  . Smoking status: Never Smoker  . Smokeless tobacco: Never Used  Substance Use Topics  . Alcohol use: Yes    Alcohol/week: 0.0 oz    Comment: occasionally  . Drug use: No    No Known Allergies  Current Outpatient Medications  Medication Sig Dispense Refill  . omeprazole (PRILOSEC) 20 MG capsule Take 1 capsule (20 mg total) by mouth daily. 90 capsule 1  . zolpidem (AMBIEN) 5 MG tablet TAKE ONE TABLET BY MOUTH ONCE DAILY AT BEDTIME AS NEEDED 30 tablet 2   No current facility-administered medications for this visit.     Review of Systems Review of Systems   Constitutional: Negative.   Respiratory: Negative.   Cardiovascular: Negative.   Gastrointestinal: Negative.     Blood pressure 140/72, pulse 71, resp. rate 13, height '5\' 6"'  (1.676 m), weight 158 lb (71.7 kg).  Physical Exam Physical Exam  Constitutional: She is oriented to person, place, and time. She appears well-developed and well-nourished.  Eyes: Conjunctivae are normal. No scleral icterus.  Neck: Neck supple.  Cardiovascular: Normal rate, regular rhythm and normal heart sounds.  Pulmonary/Chest: Effort normal and breath sounds normal.  Abdominal: Soft. Bowel sounds are normal.    Lymphadenopathy:    She has no cervical adenopathy.  Neurological: She is alert and oriented to person, place, and time.  Skin: Skin is warm and dry.    Data Reviewed CEA: 2.9.  Minimal change from level of 2.7 last year.  Never above 3.  Assessment    Doing well now 14 years status post colon resection.  Genetic testing negative.    Plan  Return in one year colon cancer follow up. The patient is aware to call back for any questions or concerns. At this late date, the likelihood that the patient is going to show recurrent disease from her old tumor is essentially 0.  We are in agreement that we can leave off the CEA for follow-up exams.  Next colonoscopy will be due in 2022.  HPI, Physical Exam, Assessment and Plan have been scribed under the direction and in the presence of Hervey Ard, MD.  Gaspar Cola, CMA  I have completed the exam and reviewed the above documentation for accuracy and completeness.  I agree with the above.  Haematologist has been used and any errors in dictation or transcription are unintentional.  Hervey Ard, M.D., F.A.C.S.   Forest Gleason Byrnett 10/08/2017, 10:58 AM

## 2017-10-13 ENCOUNTER — Other Ambulatory Visit: Payer: Self-pay | Admitting: General Surgery

## 2017-10-13 ENCOUNTER — Ambulatory Visit
Admission: RE | Admit: 2017-10-13 | Discharge: 2017-10-13 | Disposition: A | Discharge status: Home / Self Care | Payer: BC Managed Care – PPO | Source: Ambulatory Visit | Attending: General Surgery | Admitting: General Surgery

## 2017-10-13 DIAGNOSIS — Z1231 Encounter for screening mammogram for malignant neoplasm of breast: Secondary | ICD-10-CM | POA: Insufficient documentation

## 2017-10-21 ENCOUNTER — Encounter: Payer: Self-pay | Admitting: Family Medicine

## 2017-10-21 ENCOUNTER — Ambulatory Visit (INDEPENDENT_AMBULATORY_CARE_PROVIDER_SITE_OTHER): Payer: BC Managed Care – PPO | Admitting: Family Medicine

## 2017-10-21 VITALS — BP 152/82 | HR 66 | Temp 98.2°F | Resp 12 | Ht 65.0 in | Wt 159.9 lb

## 2017-10-21 DIAGNOSIS — I10 Essential (primary) hypertension: Secondary | ICD-10-CM | POA: Insufficient documentation

## 2017-10-21 DIAGNOSIS — Z0000 Encounter for general adult medical examination without abnormal findings: Secondary | ICD-10-CM | POA: Diagnosis not present

## 2017-10-21 DIAGNOSIS — Z85038 Personal history of other malignant neoplasm of large intestine: Secondary | ICD-10-CM

## 2017-10-21 NOTE — Progress Notes (Signed)
Patient ID: Dawn Holt, female   DOB: 01-17-1961, 57 y.o.   MRN: [id number]   Subjective:   Dawn Holt is a 57 y.o. female here for a complete physical exam  Interim issues since last visit: she is awfully active, doing boot camp; joined [employer]  USPSTF grade A and B recommendations Depression:  Depression screen [facility] 2/9 10/21/2017 09/17/2016 11/05/2015 11/20/2014  Decreased Interest 0 0 0 0  Down, Depressed, Hopeless 0 0 0 0  PHQ - 2 Score 0 0 0 0   Hypertension: runs in the family; mother and father; lots of stress this week BP Readings from Last 3 Encounters:  10/21/17 (!) 152/82  10/06/17 140/72  09/30/16 (!) 152/82   Obesity: working on it [employer] from Last 3 Encounters:  10/21/17 159 lb 14.4 oz (72.5 kg)  10/06/17 158 lb (71.7 kg)  09/30/16 158 lb (71.7 kg)   BMI Readings from Last 3 Encounters:  10/21/17 26.61 kg/m  10/06/17 25.50 kg/m  09/30/16 25.50 kg/m    Skin cancer: nothing worrisome Lung cancer:  never Breast cancer: just had mammo; no sx at all; surg does not do breast exam Colorectal cancer: through Dr. Bary Castilla; started at age 54 Cervical cancer screening: done by Dr. Leota Sauers in 2017; no hx of abnormal BRCA gene screening: family hx of breast and/or ovarian cancer and/or metastatic prostate cancer? Brother has prostate cancer, doing well; no mets HIV, hep B, hep C: not interested STD testing and prevention (chl/gon/syphilis): not intereested Intimate partner violence: no abuse Contraception: n/a Osteoporosis: start at age 28 Fall prevention/vitamin D: discussed Immunizations: declined tetanus; consider shingrix; flu shots yearly Diet: good eater, veggies and fruit Exercise: walks every day Alcohol: wine, 3x a week Tobacco use: no AAA: n/a Aspirin: 6.4%; once BP is controlled Glucose:  Glucose  Date Value Ref Range Status  11/20/2014 107 (H) 65 - 99 mg/dL Final   Glucose, Bld  Date Value Ref Range Status  03/03/2016 98 65  - 99 mg/dL Final   Lipids:  Lab Results  Component Value Date   CHOL 232 (H) 03/03/2016   CHOL 239 (H) 11/20/2014   Lab Results  Component Value Date   HDL 79 03/03/2016   HDL 82 11/20/2014   Lab Results  Component Value Date   LDLCALC 139 (H) 03/03/2016   LDLCALC 143 (H) 11/20/2014   Lab Results  Component Value Date   TRIG 69 03/03/2016   TRIG 72 11/20/2014   Lab Results  Component Value Date   CHOLHDL 2.9 03/03/2016   CHOLHDL 2.9 11/20/2014   No results found for: LDLDIRECT   Past Medical History:  Diagnosis Date  . Bowel trouble 11/2003  . Breast screening, unspecified 03/08/2012  . Dysphagia, pharyngoesophageal phase 2013  . Genetic testing 07/27/2013   Myriad coloaris genetic testing -negative  . Hemorrhoids 2005  . Personal history of malignant neoplasm of large intestine 2005  . Special screening for malignant neoplasms, colon 03/08/2012   Past Surgical History:  Procedure Laterality Date  . COLON SURGERY  2005   colon resection  . COLONOSCOPY  2005,2008,2012  . COLONOSCOPY WITH PROPOFOL N/A 11/05/2015   Procedure: COLONOSCOPY WITH PROPOFOL;  Surgeon: Robert Bellow, MD;  Location: [facility] ENDOSCOPY;  Service: Endoscopy;  Laterality: N/A;   Family History  Problem Relation Age of Onset  . Colon polyps Mother   . Colon polyps Maternal Aunt 62       with cancer  . Colon polyps Brother  68  . Colon polyps Maternal Aunt 65       with cancer  . Breast cancer Cousin        pat cousin  . Breast cancer Maternal Aunt 85   Social History   Tobacco Use  . Smoking status: Never Smoker  . Smokeless tobacco: Never Used  Substance Use Topics  . Alcohol use: Yes    Alcohol/week: 0.0 oz    Comment: occasionally  . Drug use: No   Review of Systems  Constitutional: Negative for unexpected weight change.  HENT: Negative for hearing loss.   Eyes: Negative for visual disturbance.  Respiratory: Negative for shortness of breath.   Cardiovascular: Negative  for chest pain and leg swelling.  Gastrointestinal: Negative for blood in stool.  Endocrine: Negative for polydipsia.  Genitourinary: Negative for hematuria.  Musculoskeletal: Negative for joint swelling.  Skin:       Nothing worrisome  Allergic/Immunologic: Negative for food allergies.  Neurological: Negative for tremors.  Hematological: Negative for adenopathy. Does not bruise/bleed easily.  Psychiatric/Behavioral: Negative for dysphoric mood.    Objective:   Vitals:   10/21/17 1402  BP: (!) 152/82  Pulse: 66  Resp: 12  Temp: 98.2 F (36.8 C)  TempSrc: Oral  SpO2: 96%  Weight: 159 lb 14.4 oz (72.5 kg)  Height: 5' 5" (1.651 m)   Body mass index is 26.61 kg/m. Wt Readings from Last 3 Encounters:  10/21/17 159 lb 14.4 oz (72.5 kg)  10/06/17 158 lb (71.7 kg)  09/30/16 158 lb (71.7 kg)   Physical Exam  Constitutional: She appears well-developed and well-nourished.  HENT:  Head: Normocephalic and atraumatic.  Right Ear: Hearing, tympanic membrane, external ear and ear canal normal.  Left Ear: Hearing, tympanic membrane, external ear and ear canal normal.  Eyes: Conjunctivae and EOM are normal. Right eye exhibits no hordeolum. Left eye exhibits no hordeolum. No scleral icterus.  Neck: Carotid bruit is not present. No thyromegaly present.  Cardiovascular: Normal rate, regular rhythm, S1 normal, S2 normal and normal heart sounds.  No extrasystoles are present.  Pulmonary/Chest: Effort normal and breath sounds normal. No respiratory distress. Right breast exhibits no inverted nipple, no mass, no nipple discharge, no skin change and no tenderness. Left breast exhibits no inverted nipple, no mass, no nipple discharge, no skin change and no tenderness. Breasts are symmetrical.  Abdominal: Soft. Normal appearance and bowel sounds are normal. She exhibits no distension, no abdominal bruit, no pulsatile midline mass and no mass. There is no hepatosplenomegaly. There is no tenderness. No  hernia.  Musculoskeletal: Normal range of motion. She exhibits no edema.  Lymphadenopathy:       Head (right side): No submandibular adenopathy present.       Head (left side): No submandibular adenopathy present.    She has no cervical adenopathy.    She has no axillary adenopathy.  Neurological: She is alert. She displays no tremor. No cranial nerve deficit. She exhibits normal muscle tone. Gait normal.  Reflex Scores:      Patellar reflexes are 2+ on the right side and 2+ on the left side. Skin: Skin is warm and dry. No bruising and no ecchymosis noted. No cyanosis. No pallor.  Psychiatric: Her speech is normal and behavior is normal. Thought content normal. Her mood appears not anxious. She does not exhibit a depressed mood.    Assessment/Plan:   Problem List Items Addressed This Visit      Cardiovascular and Mediastinum   Essential hypertension,  benign    Discussed pressure, risk of stroke, etc; encouraged DASH guidelines; she does not want medicine today; she will work on weight loss and DASH guidelines; return in 4 weeks for recheck        Other   Preventative health care - Primary    USPSTF grade A and B recommendations reviewed with patient; age-appropriate recommendations, preventive care, screening tests, etc discussed and encouraged; healthy living encouraged; see AVS for patient education given to patient       Relevant Orders   CBC with Differential/Platelet   COMPLETE METABOLIC PANEL WITH GFR   Lipid panel   TSH   History of colon cancer    Managed by Dr. Byrnett, surgeon         Orders Placed This Encounter  Procedures  . CBC with Differential/Platelet  . COMPLETE METABOLIC PANEL WITH GFR  . Lipid panel  . TSH    Follow up plan: Return in about 1 year (around 10/22/2018) for complete physical; 4 weeks visit for blood pressure.  An After Visit Summary was printed and given to the patient. 

## 2017-10-21 NOTE — Patient Instructions (Addendum)
Check out the information at familydoctor.org entitled "Nutrition for Weight Loss: What You Need to Know about Fad Diets" Try to lose between 1-2 pounds per week by taking in fewer calories and burning off more calories You can succeed by limiting portions, limiting foods dense in calories and fat, becoming more active, and drinking 8 glasses of water a day (64 ounces) Don't skip meals, especially breakfast, as skipping meals may alter your metabolism Do not use over-the-counter weight loss pills or gimmicks that claim rapid weight loss A healthy BMI (or body mass index) is between 18.5 and 24.9 You can calculate your ideal BMI at the [place] website ClubMonetize.fr  Try to follow the DASH guidelines (DASH stands for Dietary Approaches to Stop Hypertension). Try to limit the sodium in your diet to no more than 1,'500mg'$  of sodium per day. Certainly try to not exceed 2,000 mg per day at the very most. Do not add salt when cooking or at the table.  Check the sodium amount on labels when shopping, and choose items lower in sodium when given a choice. Avoid or limit foods that already contain a lot of sodium. Eat a diet rich in fruits and vegetables and whole grains, and try to lose weight if overweight or obese   DASH Eating Plan DASH stands for "Dietary Approaches to Stop Hypertension." The DASH eating plan is a healthy eating plan that has been shown to reduce high blood pressure (hypertension). It may also reduce your risk for type 2 diabetes, heart disease, and stroke. The DASH eating plan may also help with weight loss. What are tips for following this plan? General guidelines  Avoid eating more than 2,300 mg (milligrams) of salt (sodium) a day. If you have hypertension, you may need to reduce your sodium intake to 1,500 mg a day.  Limit alcohol intake to no more than 1 drink a day for nonpregnant women and 2 drinks a day for men. One drink equals 12  oz of beer, 5 oz of wine, or 1 oz of hard liquor.  Work with your health care provider to maintain a healthy body weight or to lose weight. Ask what an ideal weight is for you.  Get at least 30 minutes of exercise that causes your heart to beat faster (aerobic exercise) most days of the week. Activities may include walking, swimming, or biking.  Work with your health care provider or diet and nutrition specialist (dietitian) to adjust your eating plan to your individual calorie needs. Reading food labels  Check food labels for the amount of sodium per serving. Choose foods with less than 5 percent of the Daily Value of sodium. Generally, foods with less than 300 mg of sodium per serving fit into this eating plan.  To find whole grains, look for the word "whole" as the first word in the ingredient list. Shopping  Buy products labeled as "low-sodium" or "no salt added."  Buy fresh foods. Avoid canned foods and premade or frozen meals. Cooking  Avoid adding salt when cooking. Use salt-free seasonings or herbs instead of table salt or sea salt. Check with your health care provider or pharmacist before using salt substitutes.  Do not fry foods. Cook foods using healthy methods such as baking, boiling, grilling, and broiling instead.  Cook with heart-healthy oils, such as olive, canola, soybean, or sunflower oil. Meal planning   Eat a balanced diet that includes: ? 5 or more servings of fruits and vegetables each day. At each meal, try  to fill half of your plate with fruits and vegetables. ? Up to 6-8 servings of whole grains each day. ? Less than 6 oz of lean meat, poultry, or fish each day. A 3-oz serving of meat is about the same size as a deck of cards. One egg equals 1 oz. ? 2 servings of low-fat dairy each day. ? A serving of nuts, seeds, or beans 5 times each week. ? Heart-healthy fats. Healthy fats called Omega-3 fatty acids are found in foods such as flaxseeds and coldwater fish,  like sardines, salmon, and mackerel.  Limit how much you eat of the following: ? Canned or prepackaged foods. ? Food that is high in trans fat, such as fried foods. ? Food that is high in saturated fat, such as fatty meat. ? Sweets, desserts, sugary drinks, and other foods with added sugar. ? Full-fat dairy products.  Do not salt foods before eating.  Try to eat at least 2 vegetarian meals each week.  Eat more home-cooked food and less restaurant, buffet, and fast food.  When eating at a restaurant, ask that your food be prepared with less salt or no salt, if possible. What foods are recommended? The items listed may not be a complete list. Talk with your dietitian about what dietary choices are best for you. Grains Whole-grain or whole-wheat bread. Whole-grain or whole-wheat pasta. Brown rice. [place]. Bulgur. Whole-grain and low-sodium cereals. Pita bread. Low-fat, low-sodium crackers. Whole-wheat flour tortillas. Vegetables Fresh or frozen vegetables (raw, steamed, roasted, or grilled). Low-sodium or reduced-sodium tomato and vegetable juice. Low-sodium or reduced-sodium tomato sauce and tomato paste. Low-sodium or reduced-sodium canned vegetables. Fruits All fresh, dried, or frozen fruit. Canned fruit in natural juice (without added sugar). Meat and other protein foods Skinless chicken or [place]. Ground chicken or [place]. Pork with fat trimmed off. Fish and seafood. Egg whites. Dried beans, peas, or lentils. Unsalted nuts, nut butters, and seeds. Unsalted canned beans. Lean cuts of beef with fat trimmed off. Low-sodium, lean deli meat. Dairy Low-fat (1%) or fat-free (skim) milk. Fat-free, low-fat, or reduced-fat cheeses. Nonfat, low-sodium ricotta or cottage cheese. Low-fat or nonfat yogurt. Low-fat, low-sodium cheese. Fats and oils Soft margarine without trans fats. Vegetable oil. Low-fat, reduced-fat, or light mayonnaise and salad dressings (reduced-sodium). Canola,  safflower, olive, soybean, and sunflower oils. Avocado. Seasoning and other foods Herbs. Spices. Seasoning mixes without salt. Unsalted popcorn and pretzels. Fat-free sweets. What foods are not recommended? The items listed may not be a complete list. Talk with your dietitian about what dietary choices are best for you. Grains Baked goods made with fat, such as croissants, muffins, or some breads. Dry pasta or rice meal packs. Vegetables Creamed or fried vegetables. Vegetables in a cheese sauce. Regular canned vegetables (not low-sodium or reduced-sodium). Regular canned tomato sauce and paste (not low-sodium or reduced-sodium). Regular tomato and vegetable juice (not low-sodium or reduced-sodium). Angie Fava. Olives. Fruits Canned fruit in a light or heavy syrup. Fried fruit. Fruit in cream or butter sauce. Meat and other protein foods Fatty cuts of meat. Ribs. Fried meat. Berniece Salines. Sausage. Bologna and other processed lunch meats. Salami. Fatback. Hotdogs. Bratwurst. Salted nuts and seeds. Canned beans with added salt. Canned or smoked fish. Whole eggs or egg yolks. Chicken or [place] with skin. Dairy Whole or 2% milk, cream, and half-and-half. Whole or full-fat cream cheese. Whole-fat or sweetened yogurt. Full-fat cheese. Nondairy creamers. Whipped toppings. Processed cheese and cheese spreads. Fats and oils Butter. Stick margarine. Lard. Shortening. Ghee. Berniece Salines  fat. Tropical oils, such as coconut, palm kernel, or palm oil. Seasoning and other foods Salted popcorn and pretzels. Onion salt, garlic salt, seasoned salt, table salt, and sea salt. Worcestershire sauce. Tartar sauce. Barbecue sauce. Teriyaki sauce. Soy sauce, including reduced-sodium. Steak sauce. Canned and packaged gravies. Fish sauce. Oyster sauce. Cocktail sauce. Horseradish that you find on the shelf. Ketchup. Mustard. Meat flavorings and tenderizers. Bouillon cubes. Hot sauce and Tabasco sauce. Premade or packaged marinades. Premade or  packaged taco seasonings. Relishes. Regular salad dressings. Where to find more information:  National Heart, Lung, and [place]: https://wilson-eaton.com/  American Heart Association: www.heart.org Summary  The DASH eating plan is a healthy eating plan that has been shown to reduce high blood pressure (hypertension). It may also reduce your risk for type 2 diabetes, heart disease, and stroke.  With the DASH eating plan, you should limit salt (sodium) intake to 2,300 mg a day. If you have hypertension, you may need to reduce your sodium intake to 1,500 mg a day.  When on the DASH eating plan, aim to eat more fresh fruits and vegetables, whole grains, lean proteins, low-fat dairy, and heart-healthy fats.  Work with your health care provider or diet and nutrition specialist (dietitian) to adjust your eating plan to your individual calorie needs. This information is not intended to replace advice given to you by your health care provider. Make sure you discuss any questions you have with your health care provider. Document Released: 04/01/2011 Document Revised: 04/05/2016 Document Reviewed: 04/05/2016 Elsevier Interactive Patient Education  [employer].  Consider getting the new shingles vaccine called Shingrix; that is available for individuals 60 years of age and older, and is recommended even if you have had shingles in the past and/or already received the old shingles vaccine (Zostavax); it is a two-part series, and is available at many local pharmacies  Health Maintenance, Female Adopting a healthy lifestyle and getting preventive care can go a long way to promote health and wellness. Talk with your health care provider about what schedule of regular examinations is right for you. This is a good chance for you to check in with your provider about disease prevention and staying healthy. In between checkups, there are plenty of things you can do on your own. Experts have done a lot of  research about which lifestyle changes and preventive measures are most likely to keep you healthy. Ask your health care provider for more information. Weight and diet Eat a healthy diet  Be sure to include plenty of vegetables, fruits, low-fat dairy products, and lean protein.  Do not eat a lot of foods high in solid fats, added sugars, or salt.  Get regular exercise. This is one of the most important things you can do for your health. ? Most adults should exercise for at least 150 minutes each week. The exercise should increase your heart rate and make you sweat (moderate-intensity exercise). ? Most adults should also do strengthening exercises at least twice a week. This is in addition to the moderate-intensity exercise.  Maintain a healthy weight  Body mass index (BMI) is a measurement that can be used to identify possible weight problems. It estimates body fat based on height and weight. Your health care provider can help determine your BMI and help you achieve or maintain a healthy weight.  For females 49 years of age and older: ? A BMI below 18.5 is considered underweight. ? A BMI of 18.5 to 24.9 is normal. ? A  BMI of 25 to 29.9 is considered overweight. ? A BMI of 30 and above is considered obese.  Watch levels of cholesterol and blood lipids  You should start having your blood tested for lipids and cholesterol at 59 years of age, then have this test every 5 years.  You may need to have your cholesterol levels checked more often if: ? Your lipid or cholesterol levels are high. ? You are older than 59 years of age. ? You are at high risk for heart disease.  Cancer screening Lung Cancer  Lung cancer screening is recommended for adults 87-25 years old who are at high risk for lung cancer because of a history of smoking.  A yearly low-dose CT scan of the lungs is recommended for people who: ? Currently smoke. ? Have quit within the past 15 years. ? Have at least a  30-pack-year history of smoking. A pack year is smoking an average of one pack of cigarettes a day for 1 year.  Yearly screening should continue until it has been 15 years since you quit.  Yearly screening should stop if you develop a health problem that would prevent you from having lung cancer treatment.  Breast Cancer  Practice breast self-awareness. This means understanding how your breasts normally appear and feel.  It also means doing regular breast self-exams. Let your health care provider know about any changes, no matter how small.  If you are in your 20s or 30s, you should have a clinical breast exam (CBE) by a health care provider every 1-3 years as part of a regular health exam.  If you are 42 or older, have a CBE every year. Also consider having a breast X-ray (mammogram) every year.  If you have a family history of breast cancer, talk to your health care provider about genetic screening.  If you are at high risk for breast cancer, talk to your health care provider about having an MRI and a mammogram every year.  Breast cancer gene (BRCA) assessment is recommended for women who have family members with BRCA-related cancers. BRCA-related cancers include: ? Breast. ? Ovarian. ? Tubal. ? Peritoneal cancers.  Results of the assessment will determine the need for genetic counseling and BRCA1 and BRCA2 testing.  Cervical Cancer Your health care provider may recommend that you be screened regularly for cancer of the pelvic organs (ovaries, uterus, and vagina). This screening involves a pelvic examination, including checking for microscopic changes to the surface of your cervix (Pap test). You may be encouraged to have this screening done every 3 years, beginning at age 40.  For women ages 95-65, health care providers may recommend pelvic exams and Pap testing every 3 years, or they may recommend the Pap and pelvic exam, combined with testing for human papilloma virus (HPV), every  5 years. Some types of HPV increase your risk of cervical cancer. Testing for HPV may also be done on women of any age with unclear Pap test results.  Other health care providers may not recommend any screening for nonpregnant women who are considered low risk for pelvic cancer and who do not have symptoms. Ask your health care provider if a screening pelvic exam is right for you.  If you have had past treatment for cervical cancer or a condition that could lead to cancer, you need Pap tests and screening for cancer for at least 20 years after your treatment. If Pap tests have been discontinued, your risk factors (such as having a new  sexual partner) need to be reassessed to determine if screening should resume. Some women have medical problems that increase the chance of getting cervical cancer. In these cases, your health care provider may recommend more frequent screening and Pap tests.  Colorectal Cancer  This type of cancer can be detected and often prevented.  Routine colorectal cancer screening usually begins at 59 years of age and continues through 59 years of age.  Your health care provider may recommend screening at an earlier age if you have risk factors for colon cancer.  Your health care provider may also recommend using home test kits to check for hidden blood in the stool.  A small camera at the end of a tube can be used to examine your colon directly (sigmoidoscopy or colonoscopy). This is done to check for the earliest forms of colorectal cancer.  Routine screening usually begins at age 85.  Direct examination of the colon should be repeated every 5-10 years through 59 years of age. However, you may need to be screened more often if early forms of precancerous polyps or small growths are found.  Skin Cancer  Check your skin from head to toe regularly.  Tell your health care provider about any new moles or changes in moles, especially if there is a change in a mole's shape  or color.  Also tell your health care provider if you have a mole that is larger than the size of a pencil eraser.  Always use sunscreen. Apply sunscreen liberally and repeatedly throughout the day.  Protect yourself by wearing long sleeves, pants, a wide-brimmed hat, and sunglasses whenever you are outside.  Heart disease, diabetes, and high blood pressure  High blood pressure causes heart disease and increases the risk of stroke. High blood pressure is more likely to develop in: ? People who have blood pressure in the high end of the normal range (130-139/85-89 mm Hg). ? People who are overweight or obese. ? People who are African American.  If you are 60-84 years of age, have your blood pressure checked every 3-5 years. If you are 34 years of age or older, have your blood pressure checked every year. You should have your blood pressure measured twice-once when you are at a hospital or clinic, and once when you are not at a hospital or clinic. Record the average of the two measurements. To check your blood pressure when you are not at a hospital or clinic, you can use: ? An automated blood pressure machine at a pharmacy. ? A home blood pressure monitor.  If you are between 83 years and 50 years old, ask your health care provider if you should take aspirin to prevent strokes.  Have regular diabetes screenings. This involves taking a blood sample to check your fasting blood sugar level. ? If you are at a normal weight and have a low risk for diabetes, have this test once every three years after 59 years of age. ? If you are overweight and have a high risk for diabetes, consider being tested at a younger age or more often. Preventing infection Hepatitis B  If you have a higher risk for hepatitis B, you should be screened for this virus. You are considered at high risk for hepatitis B if: ? You were born in a country where hepatitis B is common. Ask your health care provider which countries  are considered high risk. ? Your parents were born in a high-risk country, and you have not been immunized  against hepatitis B (hepatitis B vaccine). ? You have HIV or AIDS. ? You use needles to inject street drugs. ? You live with someone who has hepatitis B. ? You have had sex with someone who has hepatitis B. ? You get hemodialysis treatment. ? You take certain medicines for conditions, including cancer, organ transplantation, and autoimmune conditions.  Hepatitis C  Blood testing is recommended for: ? Everyone born from [birth year] through 1967. ? Anyone with known risk factors for hepatitis C.  Sexually transmitted infections (STIs)  You should be screened for sexually transmitted infections (STIs) including gonorrhea and chlamydia if: ? You are sexually active and are younger than 59 years of age. ? You are older than 59 years of age and your health care provider tells you that you are at risk for this type of infection. ? Your sexual activity has changed since you were last screened and you are at an increased risk for chlamydia or gonorrhea. Ask your health care provider if you are at risk.  If you do not have HIV, but are at risk, it may be recommended that you take a prescription medicine daily to prevent HIV infection. This is called pre-exposure prophylaxis (PrEP). You are considered at risk if: ? You are sexually active and do not regularly use condoms or know the HIV status of your partner(s). ? You take drugs by injection. ? You are sexually active with a partner who has HIV.  Talk with your health care provider about whether you are at high risk of being infected with HIV. If you choose to begin PrEP, you should first be tested for HIV. You should then be tested every 3 months for as long as you are taking PrEP. Pregnancy  If you are premenopausal and you may become pregnant, ask your health care provider about preconception counseling.  If you may become pregnant, take 400  to 800 micrograms (mcg) of folic acid every day.  If you want to prevent pregnancy, talk to your health care provider about birth control (contraception). Osteoporosis and menopause  Osteoporosis is a disease in which the bones lose minerals and strength with aging. This can result in serious bone fractures. Your risk for osteoporosis can be identified using a bone density scan.  If you are 57 years of age or older, or if you are at risk for osteoporosis and fractures, ask your health care provider if you should be screened.  Ask your health care provider whether you should take a calcium or vitamin D supplement to lower your risk for osteoporosis.  Menopause may have certain physical symptoms and risks.  Hormone replacement therapy may reduce some of these symptoms and risks. Talk to your health care provider about whether hormone replacement therapy is right for you. Follow these instructions at home:  Schedule regular health, dental, and eye exams.  Stay current with your immunizations.  Do not use any tobacco products including cigarettes, chewing tobacco, or electronic cigarettes.  If you are pregnant, do not drink alcohol.  If you are breastfeeding, limit how much and how often you drink alcohol.  Limit alcohol intake to no more than 1 drink per day for nonpregnant women. One drink equals 12 ounces of beer, 5 ounces of wine, or 1 ounces of hard liquor.  Do not use street drugs.  Do not share needles.  Ask your health care provider for help if you need support or information about quitting drugs.  Tell your health care provider if you  often feel depressed.  Tell your health care provider if you have ever been abused or do not feel safe at home. This information is not intended to replace advice given to you by your health care provider. Make sure you discuss any questions you have with your health care provider. Document Released: 10/26/2010 Document Revised: 09/18/2015  Document Reviewed: 01/14/2015 Elsevier Interactive Patient Education  [employer].

## 2017-10-21 NOTE — Assessment & Plan Note (Signed)
Discussed pressure, risk of stroke, etc; encouraged DASH guidelines; she does not want medicine today; she will work on weight loss and DASH guidelines; return in 4 weeks for recheck

## 2017-10-21 NOTE — Assessment & Plan Note (Signed)
Managed by Dr. Bary Castilla, surgeon

## 2017-10-21 NOTE — Assessment & Plan Note (Signed)
USPSTF grade A and B recommendations reviewed with patient; age-appropriate recommendations, preventive care, screening tests, etc discussed and encouraged; healthy living encouraged; see AVS for patient education given to patient  

## 2017-10-22 LAB — LIPID PANEL
CHOL/HDL RATIO: 3 (calc) (ref <5.0)
CHOLESTEROL: 219 mg/dL — AB (ref <200)
HDL: 72 mg/dL (ref >50)
LDL CHOLESTEROL (CALC): 126 mg/dL — AB
Non-HDL Cholesterol (Calc): 147 mg/dL (calc) — ABNORMAL HIGH (ref <130)
Triglycerides: 103 mg/dL (ref <150)

## 2017-10-22 LAB — COMPLETE METABOLIC PANEL WITH GFR
AG Ratio: 1.3 (calc) (ref 1.0–2.5)
ALBUMIN MSPROF: 4.4 g/dL (ref 3.6–5.1)
ALKALINE PHOSPHATASE (APISO): 82 U/L (ref 33–130)
ALT: 10 U/L (ref 6–29)
AST: 19 U/L (ref 10–35)
BUN: 14 mg/dL (ref 7–25)
CALCIUM: 9.7 mg/dL (ref 8.6–10.4)
CO2: 28 mmol/L (ref 20–32)
CREATININE: 0.98 mg/dL (ref 0.50–1.05)
Chloride: 102 mmol/L (ref 98–110)
GFR, Est African American: 74 mL/min/{1.73_m2} (ref >=60)
GFR, Est Non African American: 64 mL/min/{1.73_m2} (ref >=60)
Globulin: 3.4 g/dL (calc) (ref 1.9–3.7)
Glucose, Bld: 90 mg/dL (ref 65–139)
Potassium: 4.1 mmol/L (ref 3.5–5.3)
Sodium: 138 mmol/L (ref 135–146)
Total Bilirubin: 0.8 mg/dL (ref 0.2–1.2)
Total Protein: 7.8 g/dL (ref 6.1–8.1)

## 2017-10-22 LAB — CBC WITH DIFFERENTIAL/PLATELET
BASOS ABS: 48 {cells}/uL (ref 0–200)
BASOS PCT: 0.7 %
Eosinophils Absolute: 131 cells/uL (ref 15–500)
Eosinophils Relative: 1.9 %
HCT: 39.3 % (ref 35.0–45.0)
HEMOGLOBIN: 13.1 g/dL (ref 11.7–15.5)
Lymphs Abs: 2843 cells/uL (ref 850–3900)
MCH: 31.6 pg (ref 27.0–33.0)
MCHC: 33.3 g/dL (ref 32.0–36.0)
MCV: 94.7 fL (ref 80.0–100.0)
MPV: 10.8 fL (ref 7.5–12.5)
Monocytes Relative: 7.3 %
NEUTROS ABS: 3374 {cells}/uL (ref 1500–7800)
Neutrophils Relative %: 48.9 %
Platelets: 277 10*3/uL (ref 140–400)
RBC: 4.15 10*6/uL (ref 3.80–5.10)
RDW: 12.5 % (ref 11.0–15.0)
Total Lymphocyte: 41.2 %
WBC: 6.9 10*3/uL (ref 3.8–10.8)
WBCMIX: 504 {cells}/uL (ref 200–950)

## 2017-10-22 LAB — TSH: TSH: 1.16 mIU/L (ref 0.40–4.50)

## 2017-10-26 ENCOUNTER — Encounter: Payer: Self-pay | Admitting: Family Medicine

## 2017-11-18 ENCOUNTER — Ambulatory Visit: Payer: BC Managed Care – PPO

## 2018-10-16 ENCOUNTER — Other Ambulatory Visit: Payer: Self-pay | Admitting: Family Medicine

## 2018-10-16 DIAGNOSIS — Z1231 Encounter for screening mammogram for malignant neoplasm of breast: Secondary | ICD-10-CM

## 2018-10-19 ENCOUNTER — Ambulatory Visit: Payer: BC Managed Care – PPO | Admitting: General Surgery

## 2018-11-01 ENCOUNTER — Encounter (INDEPENDENT_AMBULATORY_CARE_PROVIDER_SITE_OTHER): Payer: Self-pay

## 2018-11-01 ENCOUNTER — Other Ambulatory Visit: Payer: Self-pay

## 2018-11-01 ENCOUNTER — Ambulatory Visit
Admission: RE | Admit: 2018-11-01 | Discharge: 2018-11-01 | Disposition: A | Discharge status: Home / Self Care | Payer: BC Managed Care – PPO | Source: Ambulatory Visit | Attending: Family Medicine | Admitting: Family Medicine

## 2018-11-01 DIAGNOSIS — Z1231 Encounter for screening mammogram for malignant neoplasm of breast: Secondary | ICD-10-CM

## 2018-11-07 ENCOUNTER — Ambulatory Visit: Payer: BC Managed Care – PPO | Admitting: General Surgery

## 2018-11-23 ENCOUNTER — Ambulatory Visit: Payer: BC Managed Care – PPO | Admitting: General Surgery

## 2018-12-07 ENCOUNTER — Encounter: Payer: Self-pay | Admitting: Family Medicine

## 2018-12-07 ENCOUNTER — Ambulatory Visit (INDEPENDENT_AMBULATORY_CARE_PROVIDER_SITE_OTHER): Payer: BC Managed Care – PPO | Admitting: Family Medicine

## 2018-12-07 ENCOUNTER — Other Ambulatory Visit (HOSPITAL_COMMUNITY)
Admission: RE | Admit: 2018-12-07 | Discharge: 2018-12-07 | Disposition: A | Discharge status: Home / Self Care | Payer: BC Managed Care – PPO | Source: Ambulatory Visit | Attending: Family Medicine | Admitting: Family Medicine

## 2018-12-07 ENCOUNTER — Other Ambulatory Visit: Payer: Self-pay

## 2018-12-07 VITALS — BP 122/70 | HR 72 | Temp 97.9°F | Resp 16 | Ht 65.0 in | Wt 145.5 lb

## 2018-12-07 DIAGNOSIS — Z1159 Encounter for screening for other viral diseases: Secondary | ICD-10-CM

## 2018-12-07 DIAGNOSIS — E7800 Pure hypercholesterolemia, unspecified: Secondary | ICD-10-CM

## 2018-12-07 DIAGNOSIS — Z01419 Encounter for gynecological examination (general) (routine) without abnormal findings: Secondary | ICD-10-CM | POA: Insufficient documentation

## 2018-12-07 DIAGNOSIS — I10 Essential (primary) hypertension: Secondary | ICD-10-CM | POA: Diagnosis not present

## 2018-12-07 NOTE — Patient Instructions (Signed)

## 2018-12-07 NOTE — Addendum Note (Signed)
Addended by: Hubbard Hartshorn on: 12/07/2018 12:32 PM   Modules accepted: Orders

## 2018-12-07 NOTE — Progress Notes (Signed)
Name: Dawn Holt   MRN: [id number]    DOB: 1960/12/24   Date:12/07/2018       Progress Note  Subjective  Chief Complaint  Chief Complaint  Patient presents with  . Annual Exam    HPI  Patient presents for annual CPE.  Diet: Balanced Exercise: Very active  USPSTF grade A and B recommendations    Office Visit from 12/07/2018 in [facility]  AUDIT-C Score  0     Depression: Phq 9 is  negative Depression screen [facility] 2/9 12/07/2018 10/21/2017 09/17/2016 11/05/2015 11/20/2014  Decreased Interest 0 0 0 0 0  Down, Depressed, Hopeless 0 0 0 0 0  PHQ - 2 Score 0 0 0 0 0  Altered sleeping 0 - - - -  Tired, decreased energy 0 - - - -  Change in appetite 0 - - - -  Feeling bad or failure about yourself  0 - - - -  Trouble concentrating 0 - - - -  Moving slowly or fidgety/restless 0 - - - -  Suicidal thoughts 0 - - - -  PHQ-9 Score 0 - - - -  Difficult doing work/chores Not difficult at all - - - -   Hypertension: BP Readings from Last 3 Encounters:  12/07/18 122/70  10/21/17 (!) 152/82  10/06/17 140/72   Obesity: Wt Readings from Last 3 Encounters:  12/07/18 145 lb 8 oz (66 kg)  10/21/17 159 lb 14.4 oz (72.5 kg)  10/06/17 158 lb (71.7 kg)   BMI Readings from Last 3 Encounters:  12/07/18 24.21 kg/m  10/21/17 26.61 kg/m  10/06/17 25.50 kg/m    Hep C Screening: Needs screening STD testing and prevention (HIV/chl/gon/syphilis): Declines any screenings today; no new partners in the last.  Intimate partner violence: No concerns Sexual History/Pain during Intercourse:  No concerns Menstrual History/LMP/Abnormal Bleeding: 6 years post-menopausal.   Incontinence Symptoms: No concerns  Advanced Care Planning: A voluntary discussion about advance care planning including the explanation and discussion of advance directives.  Discussed health care proxy and Living will, and the patient was able to identify a health care proxy as [place].  Patient  does not have a living will at present time. If patient does have living will, I have requested they bring this to the clinic to be scanned in to their chart.  Breast cancer: Mammogram UTD No results found for: HMMAMMO  BRCA gene screening: Maternal Aunt at age 32; otherwise no family history Cervical cancer screening: Due today  Osteoporosis Screening: Wants to wait until next year to do with her mammogram. No results found for: HMDEXASCAN  Lipids:  Lab Results  Component Value Date   CHOL 219 (H) 10/21/2017   CHOL 232 (H) 03/03/2016   CHOL 239 (H) 11/20/2014   Lab Results  Component Value Date   HDL 72 10/21/2017   HDL 79 03/03/2016   HDL 82 11/20/2014   Lab Results  Component Value Date   LDLCALC 126 (H) 10/21/2017   LDLCALC 139 (H) 03/03/2016   LDLCALC 143 (H) 11/20/2014   Lab Results  Component Value Date   TRIG 103 10/21/2017   TRIG 69 03/03/2016   TRIG 72 11/20/2014   Lab Results  Component Value Date   CHOLHDL 3.0 10/21/2017   CHOLHDL 2.9 03/03/2016   CHOLHDL 2.9 11/20/2014   No results found for: LDLDIRECT  Glucose:  Glucose  Date Value Ref Range Status  11/20/2014 107 (H) 65 - 99 mg/dL Final  Glucose, Bld  Date Value Ref Range Status  10/21/2017 90 65 - 139 mg/dL Final    Comment:    .        Non-fasting reference interval .   03/03/2016 98 65 - 99 mg/dL Final    Skin cancer: No concerning lesions Colorectal cancer: Denies family or personal history of colorectal cancer, no changes in BM's - no blood in stool, dark and tarry stool, mucus in stool, or constipation/diarrhea. Due in 2027 for repeat colonoscopy, does have hx polyp.  Lung cancer:  Never Smoker Low Dose CT Chest recommended if Age 86-80 years, 30 pack-year currently smoking OR have quit w/in 15years. Patient does not qualify.   ECG: Denies chest pain, shortness of breath, or palpitations.  Patient Active Problem List   Diagnosis Date Noted  . Essential hypertension, benign  10/21/2017  . Preventative health care 10/21/2017  . Heartburn 11/05/2015  . Sleeping difficulty 07/18/2015  . Post-menopausal 11/21/2014  . History of colon cancer 07/06/2014    Past Surgical History:  Procedure Laterality Date  . COLON SURGERY  2005   colon resection  . COLONOSCOPY  2005,2008,2012  . COLONOSCOPY WITH PROPOFOL N/A 11/05/2015   Procedure: COLONOSCOPY WITH PROPOFOL;  Surgeon: Robert Bellow, MD;  Location: [facility] ENDOSCOPY;  Service: Endoscopy;  Laterality: N/A;    Family History  Problem Relation Age of Onset  . Colon polyps Mother   . Colon polyps Maternal Aunt 62       with cancer  . Colon polyps Brother 55  . Colon polyps Maternal Aunt 65       with cancer  . Breast cancer Cousin        pat cousin  . Breast cancer Maternal Aunt 85    Social History   Socioeconomic History  . Marital status: Married    Spouse name: Runner, broadcasting/film/video  . Number of children: 2  . Years of education: Not on file  . Highest education level: Not on file  Occupational History  . Not on file  Social Needs  . Financial resource strain: Not hard at all  . Food insecurity    Worry: Never true    Inability: Never true  . Transportation needs    Medical: No    Non-medical: No  Tobacco Use  . Smoking status: Never Smoker  . Smokeless tobacco: Never Used  Substance and Sexual Activity  . Alcohol use: Yes    Alcohol/week: 0.0 standard drinks    Comment: occasionally  . Drug use: No  . Sexual activity: Yes    Partners: Male    Birth control/protection: Post-menopausal  Lifestyle  . Physical activity    Days per week: 5 days    Minutes per session: 90 min  . Stress: Not at all  Relationships  . Social connections    Talks on phone: More than three times a week    Gets together: Never    Attends religious service: More than 4 times per year    Active member of club or organization: Yes    Attends meetings of clubs or organizations: 1 to 4 times per year    Relationship  status: Married  . Intimate partner violence    Fear of current or ex partner: No    Emotionally abused: No    Physically abused: No    Forced sexual activity: No  Other Topics Concern  . Not on file  Social History Narrative  . Not on file  No current outpatient medications on file.  No Known Allergies   ROS  Constitutional: Negative for fever or weight change.  Respiratory: Negative for cough and shortness of breath.   Cardiovascular: Negative for chest pain or palpitations.  Gastrointestinal: Negative for abdominal pain, no bowel changes.  Musculoskeletal: Negative for gait problem or joint swelling.  Skin: Negative for rash.  Neurological: Negative for dizziness or headache.  No other specific complaints in a complete review of systems (except as listed in HPI above).  Objective  Vitals:   12/07/18 1104  BP: 122/70  Pulse: 72  Resp: 16  Temp: 97.9 F (36.6 C)  TempSrc: Oral  SpO2: 99%  Weight: 145 lb 8 oz (66 kg)  Height: _0  (1.651 m)    Body mass index is 24.21 kg/m.  Physical Exam  Constitutional: Patient appears well-developed and well-nourished. No distress.  HENT: Head: Normocephalic and atraumatic. Ears: B TMs ok, no erythema or effusion; Nose: Nose normal. Mouth/Throat: Oropharynx is clear and moist. No oropharyngeal exudate.  Eyes: Conjunctivae and EOM are normal. Pupils are equal, round, and reactive to light. No scleral icterus.  Neck: Normal range of motion. Neck supple. No JVD present. No thyromegaly present.  Cardiovascular: Normal rate, regular rhythm and normal heart sounds.  No murmur heard. No BLE edema. Pulmonary/Chest: Effort normal and breath sounds normal. No respiratory distress. Abdominal: Soft. Bowel sounds are normal, no distension. There is no tenderness. no masses Breast: no lumps or masses, no nipple discharge or rashes FEMALE GENITALIA:  External genitalia normal External urethra normal Vaginal vault normal without  discharge or lesions Cervix normal without discharge or lesions Bimanual exam normal without masses Musculoskeletal: Normal range of motion, no joint effusions. No gross deformities Neurological: he is alert and oriented to person, place, and time. No cranial nerve deficit. Coordination, balance, strength, speech and gait are normal.  Skin: Skin is warm and dry. No rash noted. No erythema.  Psychiatric: Patient has a normal mood and affect. behavior is normal. Judgment and thought content normal.  No results found for this or any previous visit (from the past 2160 hour(s)).  PHQ2/9: Depression screen [facility] 2/9 12/07/2018 10/21/2017 09/17/2016 11/05/2015 11/20/2014  Decreased Interest 0 0 0 0 0  Down, Depressed, Hopeless 0 0 0 0 0  PHQ - 2 Score 0 0 0 0 0  Altered sleeping 0 - - - -  Tired, decreased energy 0 - - - -  Change in appetite 0 - - - -  Feeling bad or failure about yourself  0 - - - -  Trouble concentrating 0 - - - -  Moving slowly or fidgety/restless 0 - - - -  Suicidal thoughts 0 - - - -  PHQ-9 Score 0 - - - -  Difficult doing work/chores Not difficult at all - - - -   Fall Risk: Fall Risk  12/07/2018 10/21/2017 09/17/2016 11/05/2015 11/20/2014  Falls in the past year? 0 No No No No  Number falls in past yr: 0 - - - -  Injury with Fall? 0 - - - -  Follow up Falls evaluation completed - - - -   Assessment & Plan  1. Well woman exam with routine gynecological exam -USPSTF grade A and B recommendations reviewed with patient; age-appropriate recommendations, preventive care, screening tests, etc discussed and encouraged; healthy living encouraged; see AVS for patient education given to patient -Discussed importance of 150 minutes of physical activity weekly, eat two servings of fish weekly,  eat one serving of tree nuts ( cashews, pistachios, pecans, almonds.Marland Kitchen) every other day, eat 6 servings of fruit/vegetables daily and drink plenty of water and avoid sweet beverages.  - Hepatitis C  antibody - Lipid panel - COMPLETE METABOLIC PANEL WITH GFR  2. Essential hypertension, benign - COMPLETE METABOLIC PANEL WITH GFR  3. Elevated LDL cholesterol level - Lipid panel  4. Need for hepatitis C screening test - Hepatitis C antibody

## 2018-12-08 LAB — COMPLETE METABOLIC PANEL WITH GFR
AG Ratio: 1.4 (calc) (ref 1.0–2.5)
ALT: 11 U/L (ref 6–29)
AST: 19 U/L (ref 10–35)
Albumin: 4.7 g/dL (ref 3.6–5.1)
Alkaline phosphatase (APISO): 73 U/L (ref 37–153)
BUN: 13 mg/dL (ref 7–25)
CO2: 29 mmol/L (ref 20–32)
Calcium: 9.9 mg/dL (ref 8.6–10.4)
Chloride: 101 mmol/L (ref 98–110)
Creat: 0.97 mg/dL (ref 0.50–0.99)
GFR, Est African American: 74 mL/min/{1.73_m2} (ref >=60)
GFR, Est Non African American: 63 mL/min/{1.73_m2} (ref >=60)
Globulin: 3.3 g/dL (calc) (ref 1.9–3.7)
Glucose, Bld: 94 mg/dL (ref 65–99)
Potassium: 4.2 mmol/L (ref 3.5–5.3)
Sodium: 139 mmol/L (ref 135–146)
Total Bilirubin: 1.1 mg/dL (ref 0.2–1.2)
Total Protein: 8 g/dL (ref 6.1–8.1)

## 2018-12-08 LAB — LIPID PANEL
Cholesterol: 246 mg/dL — ABNORMAL HIGH (ref <200)
HDL: 66 mg/dL (ref >=50)
LDL Cholesterol (Calc): 161 mg/dL (calc) — ABNORMAL HIGH
Non-HDL Cholesterol (Calc): 180 mg/dL (calc) — ABNORMAL HIGH (ref <130)
Total CHOL/HDL Ratio: 3.7 (calc) (ref <5.0)
Triglycerides: 83 mg/dL (ref <150)

## 2018-12-08 LAB — HEPATITIS C ANTIBODY
Hepatitis C Ab: NONREACTIVE
SIGNAL TO CUT-OFF: 0.01 (ref <1.00)

## 2018-12-11 LAB — CYTOLOGY - PAP
Diagnosis: NEGATIVE
HPV: NOT DETECTED

## 2019-10-30 ENCOUNTER — Other Ambulatory Visit: Payer: Self-pay | Admitting: Family Medicine

## 2019-10-30 DIAGNOSIS — Z1231 Encounter for screening mammogram for malignant neoplasm of breast: Secondary | ICD-10-CM

## 2019-11-13 ENCOUNTER — Other Ambulatory Visit: Payer: Self-pay | Admitting: Family Medicine

## 2019-11-13 DIAGNOSIS — Z1231 Encounter for screening mammogram for malignant neoplasm of breast: Secondary | ICD-10-CM

## 2019-12-10 ENCOUNTER — Other Ambulatory Visit: Payer: Self-pay

## 2019-12-10 ENCOUNTER — Ambulatory Visit
Admission: RE | Admit: 2019-12-10 | Discharge: 2019-12-10 | Disposition: A | Discharge status: Home / Self Care | Payer: BC Managed Care – PPO | Source: Ambulatory Visit | Attending: Family Medicine | Admitting: Family Medicine

## 2019-12-10 DIAGNOSIS — Z1231 Encounter for screening mammogram for malignant neoplasm of breast: Secondary | ICD-10-CM | POA: Diagnosis not present

## 2019-12-17 ENCOUNTER — Other Ambulatory Visit: Payer: Self-pay

## 2019-12-17 ENCOUNTER — Ambulatory Visit (INDEPENDENT_AMBULATORY_CARE_PROVIDER_SITE_OTHER): Payer: BC Managed Care – PPO | Admitting: Family Medicine

## 2019-12-17 ENCOUNTER — Encounter: Payer: Self-pay | Admitting: Family Medicine

## 2019-12-17 VITALS — BP 118/76 | HR 92 | Temp 98.1°F | Resp 16 | Ht 65.0 in | Wt 146.6 lb

## 2019-12-17 DIAGNOSIS — Z0000 Encounter for general adult medical examination without abnormal findings: Secondary | ICD-10-CM | POA: Diagnosis not present

## 2019-12-17 DIAGNOSIS — Z23 Encounter for immunization: Secondary | ICD-10-CM | POA: Diagnosis not present

## 2019-12-17 DIAGNOSIS — Z114 Encounter for screening for human immunodeficiency virus [HIV]: Secondary | ICD-10-CM

## 2019-12-17 DIAGNOSIS — E785 Hyperlipidemia, unspecified: Secondary | ICD-10-CM | POA: Diagnosis not present

## 2019-12-17 MED ORDER — ZOSTER VAC RECOMB ADJUVANTED 50 MCG/0.5ML IM SUSR: 0.5000 mL | Freq: Once | INTRAMUSCULAR | 1 refills | Status: AC

## 2019-12-17 NOTE — Patient Instructions (Addendum)
Preventive Care 40-61 Years Old, Female °Preventive care refers to visits with your health care provider and lifestyle choices that can promote health and wellness. This includes: °· A yearly physical exam. This may also be called an annual well check. °· Regular dental visits and eye exams. °· Immunizations. °· Screening for certain conditions. °· Healthy lifestyle choices, such as eating a healthy diet, getting regular exercise, not using drugs or products that contain nicotine and tobacco, and limiting alcohol use. °What can I expect for my preventive care visit? °Physical exam °Your health care provider will check your: °· Height and weight. This may be used to calculate body mass index (BMI), which tells if you are at a healthy weight. °· Heart rate and blood pressure. °· Skin for abnormal spots. °Counseling °Your health care provider may ask you questions about your: °· Alcohol, tobacco, and drug use. °· Emotional well-being. °· Home and relationship well-being. °· Sexual activity. °· Eating habits. °· Work and work environment. °· Method of birth control. °· Menstrual cycle. °· Pregnancy history. °What immunizations do I need? ° °Influenza (flu) vaccine °· This is recommended every year. °Tetanus, diphtheria, and pertussis (Tdap) vaccine °· You may need a Td booster every 10 years. °Varicella (chickenpox) vaccine °· You may need this if you have not been vaccinated. °Zoster (shingles) vaccine °· You may need this after age 60. °Measles, mumps, and rubella (MMR) vaccine °· You may need at least one dose of MMR if you were born in 1957 or later. You may also need a second dose. °Pneumococcal conjugate (PCV13) vaccine °· You may need this if you have certain conditions and were not previously vaccinated. °Pneumococcal polysaccharide (PPSV23) vaccine °· You may need one or two doses if you smoke cigarettes or if you have certain conditions. °Meningococcal conjugate (MenACWY) vaccine °· You may need this if you  have certain conditions. °Hepatitis A vaccine °· You may need this if you have certain conditions or if you travel or work in places where you may be exposed to hepatitis A. °Hepatitis B vaccine °· You may need this if you have certain conditions or if you travel or work in places where you may be exposed to hepatitis B. °Haemophilus influenzae type b (Hib) vaccine °· You may need this if you have certain conditions. °Human papillomavirus (HPV) vaccine °· If recommended by your health care provider, you may need three doses over 6 months. °You may receive vaccines as individual doses or as more than one vaccine together in one shot (combination vaccines). Talk with your health care provider about the risks and benefits of combination vaccines. °What tests do I need? °Blood tests °· Lipid and cholesterol levels. These may be checked every 5 years, or more frequently if you are over 50 years old. °· Hepatitis C test. °· Hepatitis B test. °Screening °· Lung cancer screening. You may have this screening every year starting at age 55 if you have a 30-pack-year history of smoking and currently smoke or have quit within the past 15 years. °· Colorectal cancer screening. All adults should have this screening starting at age 50 and continuing until age 75. Your health care provider may recommend screening at age 45 if you are at increased risk. You will have tests every 1-10 years, depending on your results and the type of screening test. °· Diabetes screening. This is done by checking your blood sugar (glucose) after you have not eaten for a while (fasting). You may have this   done every 1-3 years.  Mammogram. This may be done every 1-2 years. Talk with your health care provider about when you should start having regular mammograms. This may depend on whether you have a family history of breast cancer.  BRCA-related cancer screening. This may be done if you have a family history of breast, ovarian, tubal, or peritoneal  cancers.  Pelvic exam and Pap test. This may be done every 3 years starting at age 37. Starting at age 78, this may be done every 5 years if you have a Pap test in combination with an HPV test. Other tests  Sexually transmitted disease (STD) testing.  Bone density scan. This is done to screen for osteoporosis. You may have this scan if you are at high risk for osteoporosis. Follow these instructions at home: Eating and drinking  Eat a diet that includes fresh fruits and vegetables, whole grains, lean protein, and low-fat dairy.  Take vitamin and mineral supplements as recommended by your health care provider.  Do not drink alcohol if: ? Your health care provider tells you not to drink. ? You are pregnant, may be pregnant, or are planning to become pregnant.  If you drink alcohol: ? Limit how much you have to 0-1 drink a day. ? Be aware of how much alcohol is in your drink. In the [place] one drink equals one 12 oz bottle of beer (355 mL), one 5 oz glass of wine (148 mL), or one 1 oz glass of hard liquor (44 mL). Lifestyle  Take daily care of your teeth and gums.  Stay active. Exercise for at least 30 minutes on 5 or more days each week.  Do not use any products that contain nicotine or tobacco, such as cigarettes, e-cigarettes, and chewing tobacco. If you need help quitting, ask your health care provider.  If you are sexually active, practice safe sex. Use a condom or other form of birth control (contraception) in order to prevent pregnancy and STIs (sexually transmitted infections).  If told by your health care provider, take low-dose aspirin daily starting at age 77. What's next?  Visit your health care provider once a year for a well check visit.  Ask your health care provider how often you should have your eyes and teeth checked.  Stay up to date on all vaccines. This information is not intended to replace advice given to you by your health care provider. Make sure you  discuss any questions you have with your health care provider. Document Revised: 12/22/2017 Document Reviewed: 12/22/2017 Elsevier Patient Education  [place].   Calcium 1200 mg Vit D 3 1000 IU daily   Preventing Osteoporosis, Adult Osteoporosis is a condition that causes the bones to lose density. This means that the bones become thinner, and the normal spaces in bone tissue become larger. Low bone density can make the bones weak and cause them to break more easily. Osteoporosis cannot always be prevented, but you can take steps to lower your risk of developing this condition. How can this condition affect me? If you develop osteoporosis, you will be more likely to break bones in your wrist, spine, or hip. Even a minor accident or injury can be enough to break weak bones. The bones will also be slower to heal. Osteoporosis can cause other problems as well, such as a stooped posture or trouble with movement. Osteoporosis can occur with aging. As you get older, you may lose bone tissue more quickly, or it may be replaced  more slowly. Osteoporosis is more likely to develop if you have poor nutrition or do not get enough calcium or vitamin D. Other lifestyle factors can also play a role. By eating a well-balanced diet and making lifestyle changes, you can help keep your bones strong and healthy, lowering your chances of developing osteoporosis. What can increase my risk? The following factors may make you more likely to develop osteoporosis:  Having a family history of the condition.  Having poor nutrition or not getting enough calcium or vitamin D.  Using certain medicines, such as steroid medicines or antiseizure medicines.  Being any of the following: ? 91 years of age or older. ? Female. ? A woman who has gone through menopause (is postmenopausal). ? White (Caucasian) or of Asian descent.  Smoking or having a history of smoking.  Not being physically active (being  sedentary).  Having a small body frame. What actions can I take to prevent this?  Get enough calcium   Make sure you get enough calcium every day. Calcium is the most important mineral for bone health. Most people can get enough calcium from their diet, but supplements may be recommended for people who are at risk for osteoporosis. Follow these guidelines: ? If you are age 33 or younger, aim to get 1,000 mg of calcium every day. ? If you are older than age 19, aim to get 1,200 mg of calcium every day.  Good sources of calcium include: ? Dairy products, such as low-fat or nonfat milk, cheese, and yogurt. ? Dark green leafy vegetables, such as bok choy and broccoli. ? Foods that have had calcium added to them (calcium-fortified foods), such as orange juice, cereal, bread, soy beverages, and tofu products. ? Nuts, such as almonds.  Check nutrition labels to see how much calcium is in a food or drink. Get enough vitamin D  Try to get enough vitamin D every day. Vitamin D is the most essential vitamin for bone health. It helps the body absorb calcium. Follow these guidelines for how much vitamin D to get from food: ? If you are age 21 or younger, aim to get at least 600 international units (IU) every day. Your health care provider may suggest more. ? If you are older than age 84, aim to get at least 800 international units every day. Your health care provider may suggest more.  Good sources of vitamin D in your diet include: ? Egg yolks. ? Oily fish, such as salmon, sardines, and tuna. ? Milk and cereal fortified with vitamin D.  Your body also makes vitamin D when you are out in the sun. Exposing the bare skin on your face, arms, legs, or back to the sun for no more than 30 minutes a day, 2 times a week is more than enough. Beyond that, make sure you use sunblock to protect your skin from sunburn, which increases your risk for skin cancer. Exercise  Stay active and get exercise every  day.  Ask your health care provider what types of exercise are best for you. Weight-bearing and strength-building activities are important for building and maintaining healthy bones. Some examples of these types of activities include: ? Walking and hiking. ? Jogging and running. ? Dancing. ? Gym exercises. ? Lifting weights. ? Tennis and racquetball. ? Climbing stairs. ? Aerobics. Make other lifestyle changes  Do not use any products that contain nicotine or tobacco, such as cigarettes, e-cigarettes, and chewing tobacco. If you need help quitting, ask your  health care provider.  Lose weight if you are overweight.  If you drink alcohol: ? Limit how much you use to:  0-1 drink a day for nonpregnant women.  0-2 drinks a day for men. ? Be aware of how much alcohol is in your drink. In the [place] one drink equals one 12 oz bottle of beer (355 mL), one 5 oz glass of wine (148 mL), or one 1 oz glass of hard liquor (44 mL). Where to find support If you need help making changes to prevent osteoporosis, talk with your health care provider. You can ask for a referral to a diet and nutrition specialist (dietitian) and a physical therapist. Where to find more information Learn more about osteoporosis from:  NIH Osteoporosis and Related [place]: www.bones.SouthExposed.es  [place] Office on [employer] Health: [place]s.tn  [place]: EquipmentWeekly.com.ee Summary  Osteoporosis is a condition that causes weak bones that are more likely to break.  Eat a healthy diet, making sure you get enough calcium and vitamin D, and stay active by getting regular exercise to help prevent osteoporosis.  Other ways to reduce your risk of osteoporosis include maintaining a healthy weight and avoiding alcohol and products that contain nicotine or tobacco. This information is not intended to replace advice given to you by your health care provider. Make sure you discuss  any questions you have with your health care provider. Document Revised: 11/10/2018 Document Reviewed: 11/10/2018 Elsevier Patient Education  [place].

## 2019-12-17 NOTE — Progress Notes (Signed)
Patient: Dawn Holt, Female    DOB: 10/04/1960, 59 y.o.   MRN: [id number] Delsa Grana, PA-C Visit Date: 12/17/2019  Today's Provider: Delsa Grana, PA-C   Chief Complaint  Patient presents with  . Annual Exam   Subjective:   Annual physical exam:  Dawn Holt is a 59 y.o. female who presents today for complete physical exam.   Exercise/Activity: 5 times a week one to two hours of exercise.   Diet/nutrition: Eats a lot of fruit and vegetables, no fast food, no fried foods.   Sleep: Hot flashes sometimes at night, usually 2-3 times a week.    NA:  discuss acute complaints/ do routine f/up on chronic conditions today in addition to CPE. Advised pt of separate visit billing/coding   Hx of HLD - brother had MI in 87's, another brother had MI in 35's Family and personal hx of HLD   USPSTF grade A and B recommendations - reviewed and addressed today  Depression:  Phq 9 completed today by patient, was reviewed by me with patient in the room PHQ score is neg, mood good PHQ 2/9 Scores 12/17/2019 12/07/2018 10/21/2017 09/17/2016  PHQ - 2 Score 0 0 0 0  PHQ- 9 Score 2 0 - -   Depression screen [facility] 2/9 12/17/2019 12/07/2018 10/21/2017 09/17/2016 11/05/2015  Decreased Interest 0 0 0 0 0  Down, Depressed, Hopeless 0 0 0 0 0  PHQ - 2 Score 0 0 0 0 0  Altered sleeping 2 0 - - -  Tired, decreased energy 0 0 - - -  Change in appetite 0 0 - - -  Feeling bad or failure about yourself  0 0 - - -  Trouble concentrating 0 0 - - -  Moving slowly or fidgety/restless 0 0 - - -  Suicidal thoughts 0 0 - - -  PHQ-9 Score 2 0 - - -  Difficult doing work/chores Not difficult at all Not difficult at all - - -    Alcohol screening:   [facility] Visit from 12/17/2019 in [facility]  AUDIT-C Score 3      Immunizations and Health Maintenance: Health Maintenance  Topic Date Due  . HIV Screening  Never done  . INFLUENZA VACCINE  11/25/2019  . MAMMOGRAM  12/09/2020  .  PAP SMEAR-Modifier  12/06/2021  . COLONOSCOPY  11/04/2025  . TETANUS/TDAP  12/16/2029  . COVID-19 Vaccine  Completed  . Hepatitis C Screening  Completed     Hep C Screening: done  STD testing and prevention (HIV/chl/gon/syphilis):  HIV agreed to    Intimate partner violence:  Feels safe  Sexual History/Pain during Intercourse:   Married, sexually active, no concern  Menstrual History/LMP/Abnormal Bleeding: no AUB, menopause at around 43  No LMP recorded. Patient is postmenopausal.  Incontinence Symptoms: none  Breast cancer:  Last Mammogram: 12/10/2019 - reviewed her imaging and results today with her in the room  Cervical cancer screening: 12/07/2018 Pt family hx of cancers - breast, ovarian, uterine, colon:   none  Osteoporosis:   Discussion on osteoporosis per age, including high calcium and vitamin D supplementation, weight bearing exercises  Pt is not supplementing with daily calcium/Vit D. Bone scan/dexa - will do at age 72 Roughly experienced menopause at age 55  Skin cancer:  Hx of skin CA -  NO Discussed atypical lesions   Colorectal cancer:   Colonoscopy is Due next year with Dr. Bary Castilla Discussed concerning signs and sx of CRC,  pt denies melena, hematochezia, change in BM  Lung cancer:   Low Dose CT Chest recommended if Age 51-80 years, 30 pack-year currently smoking OR have quit w/in 15years. Patient does not qualify.    Social History   Tobacco Use  . Smoking status: Never Smoker  . Smokeless tobacco: Never Used  Vaping Use  . Vaping Use: Never used  Substance Use Topics  . Alcohol use: Yes    Alcohol/week: 0.0 standard drinks    Comment: occasionally  . Drug use: No       [facility] Visit from 12/17/2019 in [facility]  AUDIT-C Score 3      Family History  Problem Relation Age of Onset  . Colon polyps Mother   . Colon polyps Maternal Aunt 62       with cancer  . Colon polyps Brother 41  . Colon polyps Maternal Aunt 65        with cancer  . Breast cancer Cousin        pat cousin  . Breast cancer Maternal Aunt 85     Blood pressure/Hypertension: BP Readings from Last 3 Encounters:  12/17/19 118/76  12/07/18 122/70  10/21/17 (!) 152/82    Weight/Obesity: Wt Readings from Last 3 Encounters:  12/17/19 146 lb 9.6 oz (66.5 kg)  12/07/18 145 lb 8 oz (66 kg)  10/21/17 159 lb 14.4 oz (72.5 kg)   BMI Readings from Last 3 Encounters:  12/17/19 24.40 kg/m  12/07/18 24.21 kg/m  10/21/17 26.61 kg/m     Lipids:  Lab Results  Component Value Date   CHOL 246 (H) 12/07/2018   CHOL 219 (H) 10/21/2017   CHOL 232 (H) 03/03/2016   Lab Results  Component Value Date   HDL 66 12/07/2018   HDL 72 10/21/2017   HDL 79 03/03/2016   Lab Results  Component Value Date   LDLCALC 161 (H) 12/07/2018   LDLCALC 126 (H) 10/21/2017   LDLCALC 139 (H) 03/03/2016   Lab Results  Component Value Date   TRIG 83 12/07/2018   TRIG 103 10/21/2017   TRIG 69 03/03/2016   Lab Results  Component Value Date   CHOLHDL 3.7 12/07/2018   CHOLHDL 3.0 10/21/2017   CHOLHDL 2.9 03/03/2016   No results found for: LDLDIRECT Based on the results of lipid panel his/her cardiovascular risk factor ( using [place] )  in the next 10 years is: The 10-year ASCVD risk score Mikey Bussing DC Brooke Bonito., et al., 2013) is: 5%   Values used to calculate the score:     Age: 28 years     Sex: Female     Is Non-Hispanic African American: Yes     Diabetic: No     Tobacco smoker: No     Systolic Blood Pressure: 761 mmHg     Is BP treated: No     HDL Cholesterol: 66 mg/dL     Total Cholesterol: 246 mg/dL Glucose:  Glucose, Bld  Date Value Ref Range Status  12/07/2018 94 65 - 99 mg/dL Final    Comment:    .            Fasting reference interval .   10/21/2017 90 65 - 139 mg/dL Final    Comment:    .        Non-fasting reference interval .   03/03/2016 98 65 - 99 mg/dL Final   Hypertension: BP Readings from Last 3 Encounters:  12/17/19  118/76  12/07/18 122/70  10/21/17 (!) 152/82   Obesity: Wt Readings from Last 3 Encounters:  12/17/19 146 lb 9.6 oz (66.5 kg)  12/07/18 145 lb 8 oz (66 kg)  10/21/17 159 lb 14.4 oz (72.5 kg)   BMI Readings from Last 3 Encounters:  12/17/19 24.40 kg/m  12/07/18 24.21 kg/m  10/21/17 26.61 kg/m      Advanced Care Planning:  A voluntary discussion about advance care planning including the explanation and discussion of advance directives.   Discussed health care proxy and Living will, and the patient was able to identify a health care proxy as her husband.   Patient does not have a living will at present time.   Social History      She        Social History   Socioeconomic History  . Marital status: Married    Spouse name: Runner, broadcasting/film/video  . Number of children: 2  . Years of education: Not on file  . Highest education level: Not on file  Occupational History  . Not on file  Tobacco Use  . Smoking status: Never Smoker  . Smokeless tobacco: Never Used  Vaping Use  . Vaping Use: Never used  Substance and Sexual Activity  . Alcohol use: Yes    Alcohol/week: 0.0 standard drinks    Comment: occasionally  . Drug use: No  . Sexual activity: Yes    Partners: Male    Birth control/protection: Post-menopausal  Other Topics Concern  . Not on file  Social History Narrative  . Not on file   Social Determinants of Health   Financial Resource Strain:   . Difficulty of Paying Living Expenses: Not on file  Food Insecurity:   . Worried About Charity fundraiser in the Last Year: Not on file  . Ran Out of Food in the Last Year: Not on file  Transportation Needs:   . Lack of Transportation (Medical): Not on file  . Lack of Transportation (Non-Medical): Not on file  Physical Activity:   . Days of Exercise per Week: Not on file  . Minutes of Exercise per Session: Not on file  Stress:   . Feeling of Stress : Not on file  Social Connections:   . Frequency of Communication with  Friends and Family: Not on file  . Frequency of Social Gatherings with Friends and Family: Not on file  . Attends Religious Services: Not on file  . Active Member of Clubs or Organizations: Not on file  . Attends Archivist Meetings: Not on file  . Marital Status: Not on file    Family History        Family History  Problem Relation Age of Onset  . Colon polyps Mother   . Colon polyps Maternal Aunt 62       with cancer  . Colon polyps Brother 27  . Colon polyps Maternal Aunt 65       with cancer  . Breast cancer Cousin        pat cousin  . Breast cancer Maternal Aunt 85    Patient Active Problem List   Diagnosis Date Noted  . Essential hypertension, benign 10/21/2017  . Preventative health care 10/21/2017  . Heartburn 11/05/2015  . Sleeping difficulty 07/18/2015  . Post-menopausal 11/21/2014  . History of colon cancer 07/06/2014    Past Surgical History:  Procedure Laterality Date  . COLON SURGERY  2005   colon resection  . COLONOSCOPY  2005,2008,2012  . COLONOSCOPY WITH PROPOFOL N/A  11/05/2015   Procedure: COLONOSCOPY WITH PROPOFOL;  Surgeon: Robert Bellow, MD;  Location: [facility] ENDOSCOPY;  Service: Endoscopy;  Laterality: N/A;     Current Outpatient Medications:  .  Zoster Vaccine Adjuvanted [facility]) injection, Inject 0.5 mLs into the muscle once for 1 dose. And repeat once more in 2-6 months, Disp: 0.5 mL, Rfl: 1  No Known Allergies  Patient Care Team: Delsa Grana, PA-C as PCP - General (Family Medicine) Robert Bellow, MD (General Surgery)  Review of Systems  10 Systems reviewed and are negative for acute change except as noted in the HPI.   I personally reviewed active problem list, medication list, allergies, family history, social history, health maintenance, notes from last encounter, lab results, imaging with the patient/caregiver today.        Objective:   Vitals:  Vitals:   12/17/19 1042  BP: 118/76  Pulse: 92  Resp: 16    Temp: 98.1 F (36.7 C)  TempSrc: Oral  SpO2: 95%  Weight: 146 lb 9.6 oz (66.5 kg)  Height: 5\' 5"  (1.651 m)    Body mass index is 24.4 kg/m.  Physical Exam Vitals and nursing note reviewed.  Constitutional:      General: She is not in acute distress.    Appearance: Normal appearance. She is well-developed and normal weight. She is not ill-appearing, toxic-appearing or diaphoretic.     Interventions: Face mask in place.  HENT:     Head: Normocephalic and atraumatic.     Right Ear: External ear normal.     Left Ear: External ear normal.     Nose: Nose normal.     Mouth/Throat:     Mouth: Mucous membranes are moist.     Pharynx: Uvula midline.  Eyes:     General: Lids are normal. No scleral icterus.       Right eye: No discharge.        Left eye: No discharge.     Conjunctiva/sclera: Conjunctivae normal.     Pupils: Pupils are equal, round, and reactive to light.  Neck:     Trachea: Phonation normal. No tracheal deviation.  Cardiovascular:     Rate and Rhythm: Normal rate and regular rhythm.     Pulses: Normal pulses.          Radial pulses are 2+ on the right side and 2+ on the left side.       Posterior tibial pulses are 2+ on the right side and 2+ on the left side.     Heart sounds: Normal heart sounds. No murmur heard.  No friction rub. No gallop.   Pulmonary:     Effort: Pulmonary effort is normal. No respiratory distress.     Breath sounds: Normal breath sounds. No stridor. No wheezing, rhonchi or rales.  Chest:     Chest wall: No tenderness.  Abdominal:     General: Bowel sounds are normal. There is no distension.     Palpations: Abdomen is soft.     Tenderness: There is no abdominal tenderness. There is no right CVA tenderness, left CVA tenderness, guarding or rebound.  Musculoskeletal:        General: No deformity. Normal range of motion.     Cervical back: Normal range of motion and neck supple.     Right lower leg: No edema.     Left lower leg: No edema.   Lymphadenopathy:     Cervical: No cervical adenopathy.  Skin:    General: Skin  is warm and dry.     Capillary Refill: Capillary refill takes less than 2 seconds.     Coloration: Skin is not jaundiced or pale.     Findings: No rash.  Neurological:     Mental Status: She is alert and oriented to person, place, and time.     Motor: No abnormal muscle tone.     Gait: Gait normal.  Psychiatric:        Mood and Affect: Mood normal.        Speech: Speech normal.        Behavior: Behavior normal.       Fall Risk: Fall Risk  12/17/2019 12/07/2018 10/21/2017 09/17/2016 11/05/2015  Falls in the past year? - 0 No No No  Number falls in past yr: 0 0 - - -  Injury with Fall? 0 0 - - -  Follow up - Falls evaluation completed - - -    Functional Status Survey: Is the patient deaf or have difficulty hearing?: No Does the patient have difficulty seeing, even when wearing glasses/contacts?: No Does the patient have difficulty concentrating, remembering, or making decisions?: No Does the patient have difficulty walking or climbing stairs?: No Does the patient have difficulty dressing or bathing?: No Does the patient have difficulty doing errands alone such as visiting a doctor's office or shopping?: No   Assessment & Plan:    CPE completed today  . USPSTF grade A and B recommendations reviewed with patient; age-appropriate recommendations, preventive care, screening tests, etc discussed and encouraged; healthy living encouraged; see AVS for patient education given to patient  . Discussed importance of 150 minutes of physical activity weekly, AHA exercise recommendations given to pt in AVS/handout  . Discussed importance of healthy diet:  eating lean meats and proteins, avoiding trans fats and saturated fats, avoid simple sugars and excessive carbs in diet, eat 6 servings of fruit/vegetables daily and drink plenty of water and avoid sweet beverages.    . Recommended pt to do annual eye exam  and routine dental exams/cleanings  . Depression, alcohol, fall screening completed as documented above and per flowsheets  . Reviewed Health Maintenance: Health Maintenance  Topic Date Due  . HIV Screening  Never done  . INFLUENZA VACCINE  11/25/2019  . MAMMOGRAM  12/09/2020  . PAP SMEAR-Modifier  12/06/2021  . COLONOSCOPY  11/04/2025  . TETANUS/TDAP  12/16/2029  . COVID-19 Vaccine  Completed  . Hepatitis C Screening  Completed      ICD-10-CM   1. Adult general medical exam  Z00.00 Comprehensive metabolic panel    Lipid panel    CBC with Differential  2. Screening for HIV without presence of risk factors  Z11.4 HIV Antibody (routine testing w rflx)  3. Need for tetanus booster  Z23 Tdap vaccine greater than or equal to 7yo IM  4. Hyperlipidemia, unspecified hyperlipidemia type  E78.5 Comprehensive metabolic panel    Lipid panel   discussed her risk and family hx, likely familial HLD - encouraged her to consider starting a low dose statin with family hx  5. Need for shingles vaccine  Z23 Zoster Vaccine Adjuvanted [facility]) injection        Delsa Grana, PA-C 12/17/19 4:47 PM  [facility]

## 2019-12-18 LAB — COMPREHENSIVE METABOLIC PANEL
AG Ratio: 1.4 (calc) (ref 1.0–2.5)
ALT: 10 U/L (ref 6–29)
AST: 19 U/L (ref 10–35)
Albumin: 4.5 g/dL (ref 3.6–5.1)
Alkaline phosphatase (APISO): 84 U/L (ref 37–153)
BUN: 13 mg/dL (ref 7–25)
CO2: 28 mmol/L (ref 20–32)
Calcium: 9.5 mg/dL (ref 8.6–10.4)
Chloride: 104 mmol/L (ref 98–110)
Creat: 0.99 mg/dL (ref 0.50–0.99)
Globulin: 3.2 g/dL (calc) (ref 1.9–3.7)
Glucose, Bld: 96 mg/dL (ref 65–99)
Potassium: 4.2 mmol/L (ref 3.5–5.3)
Sodium: 139 mmol/L (ref 135–146)
Total Bilirubin: 1 mg/dL (ref 0.2–1.2)
Total Protein: 7.7 g/dL (ref 6.1–8.1)

## 2019-12-18 LAB — CBC WITH DIFFERENTIAL/PLATELET
Absolute Monocytes: 552 cells/uL (ref 200–950)
Basophils Absolute: 28 cells/uL (ref 0–200)
Basophils Relative: 0.4 %
Eosinophils Absolute: 173 cells/uL (ref 15–500)
Eosinophils Relative: 2.5 %
HCT: 39.8 % (ref 35.0–45.0)
Hemoglobin: 13.4 g/dL (ref 11.7–15.5)
Lymphs Abs: 2291 cells/uL (ref 850–3900)
MCH: 32.8 pg (ref 27.0–33.0)
MCHC: 33.7 g/dL (ref 32.0–36.0)
MCV: 97.5 fL (ref 80.0–100.0)
MPV: 10.3 fL (ref 7.5–12.5)
Monocytes Relative: 8 %
Neutro Abs: 3857 cells/uL (ref 1500–7800)
Neutrophils Relative %: 55.9 %
Platelets: 303 10*3/uL (ref 140–400)
RBC: 4.08 10*6/uL (ref 3.80–5.10)
RDW: 12.2 % (ref 11.0–15.0)
Total Lymphocyte: 33.2 %
WBC: 6.9 10*3/uL (ref 3.8–10.8)

## 2019-12-18 LAB — LIPID PANEL
Cholesterol: 229 mg/dL — ABNORMAL HIGH (ref <200)
HDL: 71 mg/dL (ref >=50)
LDL Cholesterol (Calc): 140 mg/dL (calc) — ABNORMAL HIGH
Non-HDL Cholesterol (Calc): 158 mg/dL (calc) — ABNORMAL HIGH (ref <130)
Total CHOL/HDL Ratio: 3.2 (calc) (ref <5.0)
Triglycerides: 83 mg/dL (ref <150)

## 2019-12-18 LAB — HIV ANTIBODY (ROUTINE TESTING W REFLEX): HIV 1&2 Ab, 4th Generation: NONREACTIVE

## 2020-01-16 ENCOUNTER — Telehealth: Payer: Self-pay

## 2020-01-16 MED ORDER — BUSPIRONE HCL 5 MG PO TABS: 5.0000 mg | ORAL_TABLET | Freq: Two times a day (BID) | ORAL | 5 refills | Status: DC | PRN

## 2020-01-16 NOTE — Telephone Encounter (Signed)
Copied from [place] [id number]. Topic: General - Inquiry >> Jan 16, 2020 11:35 AM Alease Frame wrote: Reason for CRM: Pt called in [place] she was seen 2 weeks ago to talk about a sleeping pill she has made up her mind and she would like to try the medication  buspirone 15 mg . Please advise

## 2020-01-17 NOTE — Telephone Encounter (Signed)
Left detailed voicemail

## 2020-11-03 ENCOUNTER — Other Ambulatory Visit: Payer: Self-pay | Admitting: Family Medicine

## 2020-11-03 DIAGNOSIS — Z1231 Encounter for screening mammogram for malignant neoplasm of breast: Secondary | ICD-10-CM

## 2020-12-09 ENCOUNTER — Other Ambulatory Visit: Payer: Self-pay | Admitting: General Surgery

## 2020-12-09 NOTE — Progress Notes (Signed)
Subjective:     Patient ID: Dawn Holt is a 59 y.o. female.   HPI   The following portions of the patient's history were reviewed and updated as appropriate.   This an established patient is here today for: office visit. She is here to discuss having a colonoscopy, last completed in 2017, history of colon cancer in 2005. She states she is doing well.   She states her bowels move daily, no bleeding.   Review of Systems  Constitutional: Negative for chills and fever.  Respiratory: Negative for cough.   Gastrointestinal: Negative for anal bleeding.         Chief Complaint  Patient presents with   Pre-op Exam      BP (!) 152/82   Pulse 73   Temp 36.4 C (97.6 F)   Ht 165.1 cm ('5\' 5"'$ )   Wt 68.5 kg (151 lb)   SpO2 97%   BMI 25.13 kg/m        Past Medical History:  Diagnosis Date   Hemorrhoid 2005   History of malignant neoplasm of large intestine 2005   Hypertension     Pharyngoesophageal dysphagia 2013           Past Surgical History:  Procedure Laterality Date   colon surgery   2005   COLONOSCOPY   11/05/2015   COLONOSCOPY   2005   COLONOSCOPY   2008   COLONOSCOPY   2012                OB History     Gravida  3   Para  2   Term      Preterm      AB      Living  2      SAB      IAB      Ectopic      Molar      Multiple      Live Births           Obstetric Comments  Age at first period 36 Age of first pregnancy 33             Social History           Socioeconomic History   Marital status: Married  Tobacco Use   Smoking status: Never Smoker   Smokeless tobacco: Never Used  Substance and Sexual Activity   Alcohol use: Yes      Comment: wine occasionally   Drug use: Never        No Known Allergies   Current Medications        Current Outpatient Medications  Medication Sig Dispense Refill   acetaminophen (TYLENOL) 325 MG tablet Take 650 mg by mouth every 6 (six) hours as needed for Pain       busPIRone  (BUSPAR) 5 MG tablet Take 5 mg by mouth 2 (two) times daily as needed       multivit-min/iron/folic acid/K (ADULTS MULTIVITAMIN ORAL) Take 1 tablet by mouth once daily       polyethylene glycol (MIRALAX) powder One bottle for colonoscopy prep. Use as directed. 255 g 0    No current facility-administered medications for this visit.             Family History  Problem Relation Age of Onset   Colon polyps Mother     Colon polyps Brother 68   Colon polyps Maternal Aunt 70   Colon cancer Maternal Aunt     Breast  cancer Maternal Aunt     Breast cancer Cousin             Objective:   Physical Exam Exam conducted with a chaperone present.  Constitutional:      Appearance: Normal appearance.  Cardiovascular:     Rate and Rhythm: Normal rate and regular rhythm.     Pulses: Normal pulses.     Heart sounds: Normal heart sounds.  Pulmonary:     Effort: Pulmonary effort is normal.     Breath sounds: Normal breath sounds.  Musculoskeletal:     Cervical back: Neck supple.  Skin:    General: Skin is warm and dry.  Neurological:     Mental Status: She is alert and oriented to person, place, and time.  Psychiatric:        Mood and Affect: Mood normal.        Behavior: Behavior normal.    Laboratory review:   Component Ref Range & Units 11 mo ago   WBC 3.8 - 10.8 Thousand/uL 6.9   RBC 3.80 - 5.10 Million/uL 4.08   Hemoglobin 11.7 - 15.5 g/dL 13.4   HCT 35.0 - 45.0 % 39.8   MCV 80.0 - 100.0 fL 97.5   MCH 27.0 - 33.0 pg 32.8   MCHC 32.0 - 36.0 g/dL 33.7   RDW 11.0 - 15.0 % 12.2   Platelets 140 - 400 Thousand/uL 303   MPV 7.5 - 12.5 fL 10.3   Neutro Abs 1,500 - 7,800 cells/uL 3,857   Lymphs Abs 850 - 3,900 cells/uL 2,291   Absolute Monocytes 200 - 950 cells/uL 552   Eosinophils Absolute 15 - 500 cells/uL 173   Basophils Absolute 0 - 200 cells/uL 28   Neutrophils Relative % % 55.9   Total Lymphocyte % 33.2   Monocytes Relative % 8.0   Eosinophils Relative % 2.5   Basophils  Relative % 0.4    Component Ref Range & Units 11 mo ago   Glucose, Bld 65 - 99 mg/dL 96   Comment: .             Fasting reference interval  .   BUN 7 - 25 mg/dL 13   Creat 0.50 - 0.99 mg/dL 0.99   Comment: For patients >70 years of age, the reference limit  for Creatinine is approximately 13% higher for people  identified as African-American.  .   BUN/Creatinine Ratio 6 - 22 (calc) NOT APPLICABLE   Sodium [id number] - 146 mmol/L 139   Potassium 3.5 - 5.3 mmol/L 4.2   Chloride 98 - 110 mmol/L 104   CO2 20 - 32 mmol/L 28   Calcium 8.6 - 10.4 mg/dL 9.5   Total Protein 6.1 - 8.1 g/dL 7.7   Albumin 3.6 - 5.1 g/dL 4.5   Globulin 1.9 - 3.7 g/dL (calc) 3.2   AG Ratio 1.0 - 2.5 (calc) 1.4   Total Bilirubin 0.2 - 1.2 mg/dL 1.0   Alkaline phosphatase (APISO) 37 - 153 U/L 84   AST 10 - 35 U/L 19   ALT 6 - 29 U/L 10              Assessment:     Candidate for follow-up colonoscopy.    Plan:     The procedure was reviewed with the patient.  Candidate for repeat exam based on past history in 2005.      This note is partially prepared by Karie Fetch, RN, acting as a scribe in the  presence of Dr. Hervey Ard, MD.  The documentation recorded by the scribe accurately reflects the service I personally performed and the decisions made by me.    Robert Bellow, MD FACS

## 2020-12-10 ENCOUNTER — Ambulatory Visit
Admission: RE | Admit: 2020-12-10 | Discharge: 2020-12-10 | Disposition: A | Discharge status: Home / Self Care | Payer: BC Managed Care – PPO | Source: Ambulatory Visit | Attending: Family Medicine | Admitting: Family Medicine

## 2020-12-10 ENCOUNTER — Other Ambulatory Visit: Payer: Self-pay

## 2020-12-10 DIAGNOSIS — Z1231 Encounter for screening mammogram for malignant neoplasm of breast: Secondary | ICD-10-CM | POA: Insufficient documentation

## 2020-12-17 ENCOUNTER — Encounter: Payer: BC Managed Care – PPO | Admitting: Family Medicine

## 2020-12-18 ENCOUNTER — Encounter: Payer: BC Managed Care – PPO | Admitting: Family Medicine

## 2021-01-13 ENCOUNTER — Encounter: Payer: Self-pay | Admitting: General Surgery

## 2021-01-14 ENCOUNTER — Ambulatory Visit: Payer: BC Managed Care – PPO | Admitting: Anesthesiology

## 2021-01-14 ENCOUNTER — Encounter
Admission: RE | Disposition: A | Discharge status: Home / Self Care | Payer: Self-pay | Source: Ambulatory Visit | Attending: General Surgery

## 2021-01-14 ENCOUNTER — Encounter: Payer: Self-pay | Admitting: General Surgery

## 2021-01-14 ENCOUNTER — Ambulatory Visit
Admission: RE | Admit: 2021-01-14 | Discharge: 2021-01-14 | Disposition: A | Discharge status: Home / Self Care | Payer: BC Managed Care – PPO | Source: Ambulatory Visit | Attending: General Surgery | Admitting: General Surgery

## 2021-01-14 DIAGNOSIS — Z85038 Personal history of other malignant neoplasm of large intestine: Secondary | ICD-10-CM | POA: Diagnosis not present

## 2021-01-14 DIAGNOSIS — Z1211 Encounter for screening for malignant neoplasm of colon: Secondary | ICD-10-CM | POA: Diagnosis not present

## 2021-01-14 DIAGNOSIS — Z8719 Personal history of other diseases of the digestive system: Secondary | ICD-10-CM | POA: Diagnosis not present

## 2021-01-14 HISTORY — PX: COLONOSCOPY WITH PROPOFOL: SHX5780

## 2021-01-14 SURGERY — COLONOSCOPY WITH PROPOFOL
Anesthesia: General

## 2021-01-14 MED ORDER — PROPOFOL 10 MG/ML IV BOLUS
INTRAVENOUS | Status: DC | PRN
  Administered 2021-01-14: 70 mg via INTRAVENOUS
  Administered 2021-01-14: 30 mg via INTRAVENOUS

## 2021-01-14 MED ORDER — PROPOFOL 500 MG/50ML IV EMUL
INTRAVENOUS | Status: DC | PRN
  Administered 2021-01-14: 140 ug/kg/min via INTRAVENOUS

## 2021-01-14 MED ORDER — SODIUM CHLORIDE 0.9 % IV SOLN
INTRAVENOUS | Status: DC
  Administered 2021-01-14: 1000 mL via INTRAVENOUS

## 2021-01-14 MED ORDER — LIDOCAINE HCL (PF) 1 % IJ SOLN
INTRAMUSCULAR | Status: AC
  Filled 2021-01-14: qty 2

## 2021-01-14 NOTE — Anesthesia Preprocedure Evaluation (Signed)
Anesthesia Evaluation  Patient identified by MRN, date of birth, ID band Patient awake    Reviewed: Allergy & Precautions, H&P , NPO status , Patient's Chart, lab work & pertinent test results, reviewed documented beta blocker date and time   History of Anesthesia Complications Negative for: history of anesthetic complications  Airway Mallampati: III  TM Distance: >3 FB Neck ROM: full    Dental no notable dental hx. (+) Teeth Intact   Pulmonary neg pulmonary ROS,    Pulmonary exam normal breath sounds clear to auscultation       Cardiovascular Exercise Tolerance: Good negative cardio ROS Normal cardiovascular exam Rhythm:regular Rate:Normal     Neuro/Psych negative neurological ROS  negative psych ROS   GI/Hepatic Neg liver ROS, GERD  ,  Endo/Other  negative endocrine ROS  Renal/GU negative Renal ROS  negative genitourinary   Musculoskeletal negative musculoskeletal ROS (+)   Abdominal Normal abdominal exam  (+)   Peds  Hematology negative hematology ROS (+)   Anesthesia Other Findings Past Medical History:    Hemorrhoids                                     2005         Dysphagia, pharyngoesophageal phase             2013         Breast screening, unspecified                   03/08/2012   Special screening for malignant neoplasms, col* 03/08/2012   Personal history of malignant neoplasm of larg* 2005         Bowel trouble                                   11/2003      Reproductive/Obstetrics negative OB ROS                             Anesthesia Physical  Anesthesia Plan  ASA: I  Anesthesia Plan: General   Post-op Pain Management:    Induction:   PONV Risk Score and Plan: 2 and Treatment may vary due to age or medical condition  Airway Management Planned: Nasal Cannula and Natural Airway  Additional Equipment:   Intra-op Plan:   Post-operative Plan:   Informed  Consent: I have reviewed the patients History and Physical, chart, labs and discussed the procedure including the risks, benefits and alternatives for the proposed anesthesia with the patient or authorized representative who has indicated his/her understanding and acceptance.     Dental Advisory Given  Plan Discussed with: Anesthesiologist, CRNA and Surgeon  Anesthesia Plan Comments:         Anesthesia Quick Evaluation

## 2021-01-14 NOTE — H&P (Signed)
Dawn Holt [id number] [birth year]     HPI:  Healthy 60 y/o s/p low anterior resection in 2005 for cancer.  8 mm tubular adenoma removed from the sigmoid colon at the time of her 2017 exam. For follow up colonoscopy.  Tolerated prep well.   Medications Prior to Admission  Medication Sig Dispense Refill Last Dose   busPIRone (BUSPAR) 5 MG tablet Take 1-3 tablets (5-15 mg total) by mouth 2 (two) times daily as needed. 60 tablet 5    No Known Allergies Past Medical History:  Diagnosis Date   Bowel trouble 11/2003   Breast screening, unspecified 03/08/2012   Dysphagia, pharyngoesophageal phase 2013   Genetic testing 07/27/2013   Myriad coloaris genetic testing -negative   Hemorrhoids 2005   Personal history of malignant neoplasm of large intestine 2005   Special screening for malignant neoplasms, colon 03/08/2012   Past Surgical History:  Procedure Laterality Date   COLON SURGERY  2005   colon resection   COLONOSCOPY  2005,2008,2012   COLONOSCOPY WITH PROPOFOL N/A 11/05/2015   Procedure: COLONOSCOPY WITH PROPOFOL;  Surgeon: Robert Bellow, MD;  Location: [facility] ENDOSCOPY;  Service: Endoscopy;  Laterality: N/A;   Social History   Socioeconomic History   Marital status: Married    Spouse name: Runner, broadcasting/film/video   Number of children: 2   Years of education: Not on file   Highest education level: Not on file  Occupational History   Not on file  Tobacco Use   Smoking status: Never   Smokeless tobacco: Never  Vaping Use   Vaping Use: Never used  Substance and Sexual Activity   Alcohol use: Yes    Alcohol/week: 0.0 standard drinks    Comment: occasionally   Drug use: No   Sexual activity: Yes    Partners: Male    Birth control/protection: Post-menopausal  Other Topics Concern   Not on file  Social History Narrative   Not on file   Social Determinants of Health   Financial Resource Strain: Not on file  Food Insecurity: Not on file  Transportation Needs: Not on file   Physical Activity: Not on file  Stress: Not on file  Social Connections: Not on file  Intimate Partner Violence: Not on file   Social History   Social History Narrative   Not on file     ROS: Negative.     PE: HEENT: Negative. Lungs: Clear. Cardio: RR.  Assessment/Plan:  Proceed with planned endoscopy.   [facility] 01/14/2021

## 2021-01-14 NOTE — Op Note (Signed)
[facility] Patient Name: Dawn Holt Procedure Date: 01/14/2021 8:03 AM MRN: [id number] Account #: [id number] Date of Birth: November 05, 1960 Admit Type: Outpatient Age: 60 Room: [facility] ENDO ROOM 1 Gender: Female Note Status: Finalized Instrument Name: Peds Colonoscope [id number] Procedure:             Colonoscopy Indications:           High risk colon cancer surveillance: Personal history                         of colon cancer Providers:             Robert Bellow, MD Referring MD:          Delsa Grana (Referring MD) Medicines:             Propofol per Anesthesia Complications:         No immediate complications. Procedure:             Pre-Anesthesia Assessment:                        - Prior to the procedure, a History and Physical was                         performed, and patient medications, allergies and                         sensitivities were reviewed. The patient's tolerance                         of previous anesthesia was reviewed.                        - The risks and benefits of the procedure and the                         sedation options and risks were discussed with the                         patient. All questions were answered and informed                         consent was obtained.                        After obtaining informed consent, the colonoscope was                         passed under direct vision. Throughout the procedure,                         the patient's blood pressure, pulse, and oxygen                         saturations were monitored continuously. The                         Colonoscope was introduced through the anus and                         advanced to  the the cecum, identified by appendiceal                         orifice and ileocecal valve. The colonoscopy was                         performed without difficulty. The patient tolerated                         the procedure well. The quality of  the bowel                         preparation was good. Findings:      The entire examined colon appeared normal on direct and retroflexion       views. Impression:            - The entire examined colon is normal on direct and                         retroflexion views.                        - No specimens collected. Recommendation:        - Repeat colonoscopy in 5 years for surveillance. Procedure Code(s):     --- Professional ---                        [id number], Colonoscopy, flexible; diagnostic, including                         collection of specimen(s) by brushing or washing, when                         performed (separate procedure) Diagnosis Code(s):     --- Professional ---                        A41.660, Personal history of other malignant neoplasm                         of large intestine CPT copyright 2019 American Medical Association. All rights reserved. The codes documented in this report are preliminary and upon coder review may  be revised to meet current compliance requirements. Robert Bellow, MD 01/14/2021 8:50:44 AM This report has been signed electronically. Number of Addenda: 0 Note Initiated On: 01/14/2021 8:03 AM Scope Withdrawal Time: 0 hours 10 minutes 5 seconds  Total Procedure Duration: 0 hours 16 minutes 58 seconds  Estimated Blood Loss:  Estimated blood loss: none.      [facility]

## 2021-01-14 NOTE — Transfer of Care (Signed)
Immediate Anesthesia Transfer of Care Note  Patient: Dawn Holt  Procedure(s) Performed: COLONOSCOPY WITH PROPOFOL  Patient Location: PACU  Anesthesia Type:General  Level of Consciousness: awake, alert  and oriented  Airway & Oxygen Therapy: Patient Spontanous Breathing  Post-op Assessment: Report given to RN and Post -op Vital signs reviewed and stable  Post vital signs: Reviewed and stable  Last Vitals:  Vitals Value Taken Time  BP 128/67 01/14/21 0854  Temp 36.4 C 01/14/21 0853  Pulse 71 01/14/21 0856  Resp 19 01/14/21 0856  SpO2 100 % 01/14/21 0856  Vitals shown include unvalidated device data.  Last Pain:  Vitals:   01/14/21 0853  TempSrc: Temporal  PainSc: 0-No pain         Complications: No notable events documented.

## 2021-01-15 ENCOUNTER — Encounter: Payer: Self-pay | Admitting: General Surgery

## 2021-01-15 NOTE — Anesthesia Postprocedure Evaluation (Signed)
Anesthesia Post Note  Patient: Dawn Holt  Procedure(s) Performed: COLONOSCOPY WITH PROPOFOL  Patient location during evaluation: Endoscopy Anesthesia Type: General Level of consciousness: awake and alert Pain management: pain level controlled Vital Signs Assessment: post-procedure vital signs reviewed and stable Respiratory status: spontaneous breathing, nonlabored ventilation and respiratory function stable Cardiovascular status: blood pressure returned to baseline and stable Postop Assessment: no apparent nausea or vomiting Anesthetic complications: no   No notable events documented.   Last Vitals:  Vitals:   01/14/21 0913 01/14/21 0923  BP: (!) 168/82 (!) 169/73  Pulse: 63 (!) 52  Resp: 12 13  Temp:    SpO2: 100% 100%    Last Pain:  Vitals:   01/15/21 0745  TempSrc:   PainSc: 0-No pain                 Iran Ouch

## 2021-01-20 ENCOUNTER — Encounter: Payer: BC Managed Care – PPO | Admitting: Family Medicine

## 2021-01-27 ENCOUNTER — Encounter: Payer: Self-pay | Admitting: Family Medicine

## 2021-01-27 ENCOUNTER — Other Ambulatory Visit: Payer: Self-pay

## 2021-01-27 ENCOUNTER — Ambulatory Visit (INDEPENDENT_AMBULATORY_CARE_PROVIDER_SITE_OTHER): Payer: BC Managed Care – PPO | Admitting: Family Medicine

## 2021-01-27 VITALS — BP 134/82 | HR 80 | Temp 97.9°F | Resp 16 | Ht 65.0 in | Wt 149.5 lb

## 2021-01-27 DIAGNOSIS — E2839 Other primary ovarian failure: Secondary | ICD-10-CM

## 2021-01-27 DIAGNOSIS — I10 Essential (primary) hypertension: Secondary | ICD-10-CM

## 2021-01-27 DIAGNOSIS — Z780 Asymptomatic menopausal state: Secondary | ICD-10-CM

## 2021-01-27 DIAGNOSIS — Z0000 Encounter for general adult medical examination without abnormal findings: Secondary | ICD-10-CM

## 2021-01-27 NOTE — Progress Notes (Signed)
BP 134/82   Pulse 80   Temp 97.9 F (36.6 C)   Resp 16   Ht _0  (1.651 m)   Wt 149 lb 8 oz (67.8 kg)   SpO2 99%   BMI 24.88 kg/m    Subjective:    Patient ID: Dawn Holt, female    DOB: 09-12-1960, 59 y.o.   MRN: [id number]  HPI: Dawn Holt is a 60 y.o. female presenting on 01/27/2021 for comprehensive medical examination. Current medical complaints include:none  Hypertension: - Medications: none - Compliance: n/a - Checking BP at home: no  She currently lives with: husband Menopausal Symptoms: no  Depression Screen done today and results listed below:  Depression screen [facility] 2/9 01/27/2021 12/17/2019 12/07/2018 10/21/2017 09/17/2016  Decreased Interest 0 0 0 0 0  Down, Depressed, Hopeless 0 0 0 0 0  PHQ - 2 Score 0 0 0 0 0  Altered sleeping 0 2 0 - -  Tired, decreased energy 0 0 0 - -  Change in appetite 0 0 0 - -  Feeling bad or failure about yourself  0 0 0 - -  Trouble concentrating 0 0 0 - -  Moving slowly or fidgety/restless 0 0 0 - -  Suicidal thoughts 0 0 0 - -  PHQ-9 Score 0 2 0 - -  Difficult doing work/chores Not difficult at all Not difficult at all Not difficult at all - -    The patient does not have a history of falls. I did not complete a risk assessment for falls. A plan of care for falls was not documented.   Past Medical History:  Past Medical History:  Diagnosis Date   Bowel trouble 11/2003   Breast screening, unspecified 03/08/2012   Dysphagia, pharyngoesophageal phase 2013   Genetic testing 07/27/2013   Myriad coloaris genetic testing -negative   Hemorrhoids 2005   Personal history of malignant neoplasm of large intestine 2005   Special screening for malignant neoplasms, colon 03/08/2012    Surgical History:  Past Surgical History:  Procedure Laterality Date   COLON SURGERY  2005   colon resection   COLONOSCOPY  2005,2008,2012   COLONOSCOPY WITH PROPOFOL N/A 11/05/2015   Procedure: COLONOSCOPY WITH PROPOFOL;  Surgeon:  Robert Bellow, MD;  Location: [facility] ENDOSCOPY;  Service: Endoscopy;  Laterality: N/A;   COLONOSCOPY WITH PROPOFOL N/A 01/14/2021   Procedure: COLONOSCOPY WITH PROPOFOL;  Surgeon: Robert Bellow, MD;  Location: [facility] ENDOSCOPY;  Service: Endoscopy;  Laterality: N/A;    Medications:  Current Outpatient Medications on File Prior to Visit  Medication Sig   busPIRone (BUSPAR) 5 MG tablet Take 1-3 tablets (5-15 mg total) by mouth 2 (two) times daily as needed.   No current facility-administered medications on file prior to visit.    Allergies:  No Known Allergies  Social History:  Social History   Socioeconomic History   Marital status: Married    Spouse name: Runner, broadcasting/film/video   Number of children: 2   Years of education: Not on file   Highest education level: Not on file  Occupational History   Not on file  Tobacco Use   Smoking status: Never   Smokeless tobacco: Never  Vaping Use   Vaping Use: Never used  Substance and Sexual Activity   Alcohol use: Yes    Alcohol/week: 0.0 standard drinks    Comment: occasionally   Drug use: No   Sexual activity: Yes    Partners: Male    Birth  control/protection: Post-menopausal  Other Topics Concern   Not on file  Social History Narrative   Not on file   Social Determinants of Health   Financial Resource Strain: Low Risk    Difficulty of Paying Living Expenses: Not hard at all  Food Insecurity: No Food Insecurity   Worried About Charity fundraiser in the Last Year: Never true   Arboriculturist in the Last Year: Never true  Transportation Needs: No Transportation Needs   Lack of Transportation (Medical): No   Lack of Transportation (Non-Medical): No  Physical Activity: Sufficiently Active   Days of Exercise per Week: 5 days   Minutes of Exercise per Session: 60 min  Stress: Stress Concern Present   Feeling of Stress : To some extent  Social Connections: Engineer, building services of Communication with Friends and Family:  Twice a week   Frequency of Social Gatherings with Friends and Family: Three times a week   Attends Religious Services: 1 to 4 times per year   Active Member of Clubs or Organizations: Yes   Attends Archivist Meetings: 1 to 4 times per year   Marital Status: Married  Human resources officer Violence: Not At Risk   Fear of Current or Ex-Partner: No   Emotionally Abused: No   Physically Abused: No   Sexually Abused: No   Social History   Tobacco Use  Smoking Status Never  Smokeless Tobacco Never   Social History   Substance and Sexual Activity  Alcohol Use Yes   Alcohol/week: 0.0 standard drinks   Comment: occasionally    Family History:  Family History  Problem Relation Age of Onset   Colon polyps Mother    Colon polyps Maternal Aunt 62       with cancer   Colon polyps Brother 71   Colon polyps Maternal Aunt 65       with cancer   Breast cancer Cousin        pat cousin   Breast cancer Maternal Aunt 85    Past medical history, surgical history, medications, allergies, family history and social history reviewed with patient today and changes made to appropriate areas of the chart.      Objective:    BP 134/82   Pulse 80   Temp 97.9 F (36.6 C)   Resp 16   Ht _0  (1.651 m)   Wt 149 lb 8 oz (67.8 kg)   SpO2 99%   BMI 24.88 kg/m   Wt Readings from Last 3 Encounters:  01/27/21 149 lb 8 oz (67.8 kg)  01/14/21 145 lb (65.8 kg)  12/17/19 146 lb 9.6 oz (66.5 kg)    Physical Exam Vitals reviewed.  Constitutional:      Appearance: Normal appearance.  HENT:     Head: Normocephalic.     Right Ear: External ear normal.     Left Ear: External ear normal.  Eyes:     Extraocular Movements: Extraocular movements intact.  Cardiovascular:     Rate and Rhythm: Normal rate and regular rhythm.     Heart sounds: Normal heart sounds. No murmur heard. Pulmonary:     Effort: Pulmonary effort is normal.     Breath sounds: Normal breath sounds.  Abdominal:      General: Bowel sounds are normal.     Palpations: Abdomen is soft.     Tenderness: There is no abdominal tenderness.  Musculoskeletal:        General: Normal  range of motion.     Cervical back: Normal range of motion.     Right lower leg: No edema.     Left lower leg: No edema.  Lymphadenopathy:     Cervical: No cervical adenopathy.  Skin:    General: Skin is warm and dry.  Neurological:     Mental Status: She is alert and oriented to person, place, and time. Mental status is at baseline.  Psychiatric:        Mood and Affect: Mood normal.        Behavior: Behavior normal.    Results for orders placed or performed in visit on 12/17/19  Comprehensive metabolic panel  Result Value Ref Range   Glucose, Bld 96 65 - 99 mg/dL   BUN 13 7 - 25 mg/dL   Creat 0.99 0.50 - 0.99 mg/dL   BUN/Creatinine Ratio NOT APPLICABLE 6 - 22 (calc)   Sodium 139 135 - 146 mmol/L   Potassium 4.2 3.5 - 5.3 mmol/L   Chloride 104 98 - 110 mmol/L   CO2 28 20 - 32 mmol/L   Calcium 9.5 8.6 - 10.4 mg/dL   Total Protein 7.7 6.1 - 8.1 g/dL   Albumin 4.5 3.6 - 5.1 g/dL   Globulin 3.2 1.9 - 3.7 g/dL (calc)   AG Ratio 1.4 1.0 - 2.5 (calc)   Total Bilirubin 1.0 0.2 - 1.2 mg/dL   Alkaline phosphatase (APISO) 84 37 - 153 U/L   AST 19 10 - 35 U/L   ALT 10 6 - 29 U/L  Lipid panel  Result Value Ref Range   Cholesterol 229 (H) <200 mg/dL   HDL 71 > OR = 50 mg/dL   Triglycerides 83 <150 mg/dL   LDL Cholesterol (Calc) 140 (H) mg/dL (calc)   Total CHOL/HDL Ratio 3.2 <5.0 (calc)   Non-HDL Cholesterol (Calc) 158 (H) <130 mg/dL (calc)  CBC with Differential  Result Value Ref Range   WBC 6.9 3.8 - 10.8 Thousand/uL   RBC 4.08 3.80 - 5.10 Million/uL   Hemoglobin 13.4 11.7 - 15.5 g/dL   HCT 39.8 35.0 - 45.0 %   MCV 97.5 80.0 - 100.0 fL   MCH 32.8 27.0 - 33.0 pg   MCHC 33.7 32.0 - 36.0 g/dL   RDW 12.2 11.0 - 15.0 %   Platelets 303 140 - 400 Thousand/uL   MPV 10.3 7.5 - 12.5 fL   Neutro Abs 3,857 1,500 - 7,800  cells/uL   Lymphs Abs 2,291 850 - 3,900 cells/uL   Absolute Monocytes 552 200 - 950 cells/uL   Eosinophils Absolute 173 15 - 500 cells/uL   Basophils Absolute 28 0 - 200 cells/uL   Neutrophils Relative % 55.9 %   Total Lymphocyte 33.2 %   Monocytes Relative 8.0 %   Eosinophils Relative 2.5 %   Basophils Relative 0.4 %  HIV Antibody (routine testing w rflx)  Result Value Ref Range   HIV 1&2 Ab, 4th Generation NON-REACTIVE NON-REACTI      Assessment & Plan:   Problem List Items Addressed This Visit       Cardiovascular and Mediastinum   Essential hypertension, benign    Doing well on current regimen, no changes made today.      Relevant Orders   Lipid panel   Comprehensive metabolic panel     Other   Post-menopausal   Relevant Orders   DG Bone Density   Preventative health care - Primary   Other Visit Diagnoses  Estrogen deficiency       Relevant Orders   DG Bone Density        Follow up plan: Return in about 1 year (around 01/27/2022).   LABORATORY TESTING:  - Pap smear: up to date  IMMUNIZATIONS:   - Tdap: Tetanus vaccination status reviewed: last tetanus booster within 10 years. - Influenza: Refused - Pneumovax: Not applicable - Prevnar: Not applicable - HPV: Not applicable - Shingrix vaccine:  will obtain at pharmacy - COVID vaccine: has received 2 doses of mRNA vaccine  SCREENING: - Mammogram: Up to date  - Colonoscopy: Up to date  - Bone Density: Ordered today  - Lung Cancer Screening: Not applicable   Hep C Screening: UTD STD testing and prevention (HIV/chl/gon/syphilis): UTD Menstrual History/LMP/Abnormal Bleeding: post-menopausal Incontinence Symptoms: none  Osteoporosis: Discussed high calcium and vitamin D supplementation, weight bearing exercises  Advanced Care Planning: A voluntary discussion about advance care planning including the explanation and discussion of advance directives.  Discussed health care proxy and Living will, and  the patient was able to identify a health care proxy as husband, Maverick.  Patient does not have a living will at present time. If patient does have living will, I have requested they bring this to the clinic to be scanned in to their chart.  PATIENT COUNSELING:   Advised to take 1 mg of folate supplement per day if capable of pregnancy.   Sexuality: Discussed sexually transmitted diseases, partner selection, use of condoms, avoidance of unintended pregnancy  and contraceptive alternatives.   Advised to avoid cigarette smoking.  I discussed with the patient that most people either abstain from alcohol or drink within safe limits (<=14/week and <=4 drinks/occasion for males, <=7/weeks and <= 3 drinks/occasion for females) and that the risk for alcohol disorders and other health effects rises proportionally with the number of drinks per week and how often a drinker exceeds daily limits.  Discussed cessation/primary prevention of drug use and availability of treatment for abuse.   Diet: Encouraged to adjust caloric intake to maintain  or achieve ideal body weight, to reduce intake of dietary saturated fat and total fat, to limit sodium intake by avoiding high sodium foods and not adding table salt, and to maintain adequate dietary potassium and calcium preferably from fresh fruits, vegetables, and low-fat dairy products.    stressed the importance of regular exercise  Injury prevention: Discussed safety belts, safety helmets, smoke detector, smoking near bedding or upholstery.   Dental health: Discussed importance of regular tooth brushing, flossing, and dental visits.    NEXT PREVENTATIVE PHYSICAL DUE IN 1 YEAR. Return in about 1 year (around 01/27/2022).

## 2021-01-27 NOTE — Assessment & Plan Note (Signed)
Doing well on current regimen, no changes made today. 

## 2021-01-27 NOTE — Patient Instructions (Addendum)
It was great to see you!  Our plans for today:  - Check with your pharmacy about your Shingles vaccine and COVID booster.    We are checking some labs today, we will release these results to your MyChart.  Take care and seek immediate care sooner if you develop any concerns.   Dr. Ky Barban   Preventive Care 26-62 Years Old, Female Preventive care refers to lifestyle choices and visits with your health care provider that can promote health and wellness. This includes: A yearly physical exam. This is also called an annual wellness visit. Regular dental and eye exams. Immunizations. Screening for certain conditions. Healthy lifestyle choices, such as: Eating a healthy diet. Getting regular exercise. Not using drugs or products that contain nicotine and tobacco. Limiting alcohol use. What can I expect for my preventive care visit? Physical exam Your health care provider will check your: Height and weight. These may be used to calculate your BMI (body mass index). BMI is a measurement that tells if you are at a healthy weight. Heart rate and blood pressure. Body temperature. Skin for abnormal spots. Counseling Your health care provider may ask you questions about your: Past medical problems. Family's medical history. Alcohol, tobacco, and drug use. Emotional well-being. Home life and relationship well-being. Sexual activity. Diet, exercise, and sleep habits. Work and work Statistician. Access to firearms. Method of birth control. Menstrual cycle. Pregnancy history. What immunizations do I need? Vaccines are usually given at various ages, according to a schedule. Your health care provider will recommend vaccines for you based on your age, medical history, and lifestyle or other factors, such as travel or where you work. What tests do I need? Blood tests Lipid and cholesterol levels. These may be checked every 5 years, or more often if you are over 58 years old. Hepatitis C  test. Hepatitis B test. Screening Lung cancer screening. You may have this screening every year starting at age 76 if you have a 30-pack-year history of smoking and currently smoke or have quit within the past 15 years. Colorectal cancer screening. All adults should have this screening starting at age 61 and continuing until age 59. Your health care provider may recommend screening at age 19 if you are at increased risk. You will have tests every 1-10 years, depending on your results and the type of screening test. Diabetes screening. This is done by checking your blood sugar (glucose) after you have not eaten for a while (fasting). You may have this done every 1-3 years. Mammogram. This may be done every 1-2 years. Talk with your health care provider about when you should start having regular mammograms. This may depend on whether you have a family history of breast cancer. BRCA-related cancer screening. This may be done if you have a family history of breast, ovarian, tubal, or peritoneal cancers. Pelvic exam and Pap test. This may be done every 3 years starting at age 67. Starting at age 79, this may be done every 5 years if you have a Pap test in combination with an HPV test. Other tests STD (sexually transmitted disease) testing, if you are at risk. Bone density scan. This is done to screen for osteoporosis. You may have this scan if you are at high risk for osteoporosis. Talk with your health care provider about your test results, treatment options, and if necessary, the need for more tests. Follow these instructions at home: Eating and drinking  Eat a diet that includes fresh fruits and vegetables, whole  grains, lean protein, and low-fat dairy products. Take vitamin and mineral supplements as recommended by your health care provider. Do not drink alcohol if: Your health care provider tells you not to drink. You are pregnant, may be pregnant, or are planning to become pregnant. If  you drink alcohol: Limit how much you have to 0-1 drink a day. Be aware of how much alcohol is in your drink. In the [place] one drink equals one 12 oz bottle of beer (355 mL), one 5 oz glass of wine (148 mL), or one 1 oz glass of hard liquor (44 mL). Lifestyle Take daily care of your teeth and gums. Brush your teeth every morning and night with fluoride toothpaste. Floss one time each day. Stay active. Exercise for at least 30 minutes 5 or more days each week. Do not use any products that contain nicotine or tobacco, such as cigarettes, e-cigarettes, and chewing tobacco. If you need help quitting, ask your health care provider. Do not use drugs. If you are sexually active, practice safe sex. Use a condom or other form of protection to prevent STIs (sexually transmitted infections). If you do not wish to become pregnant, use a form of birth control. If you plan to become pregnant, see your health care provider for a prepregnancy visit. If told by your health care provider, take low-dose aspirin daily starting at age 71. Find healthy ways to cope with stress, such as: Meditation, yoga, or listening to music. Journaling. Talking to a trusted person. Spending time with friends and family. Safety Always wear your seat belt while driving or riding in a vehicle. Do not drive: If you have been drinking alcohol. Do not ride with someone who has been drinking. When you are tired or distracted. While texting. Wear a helmet and other protective equipment during sports activities. If you have firearms in your house, make sure you follow all gun safety procedures. What's next? Visit your health care provider once a year for an annual wellness visit. Ask your health care provider how often you should have your eyes and teeth checked. Stay up to date on all vaccines. This information is not intended to replace advice given to you by your health care provider. Make sure you discuss any questions you have  with your health care provider. Document Revised: 06/20/2020 Document Reviewed: 12/22/2017 Elsevier Patient Education  2022 [employer].

## 2021-01-28 LAB — LIPID PANEL
Cholesterol: 248 mg/dL — ABNORMAL HIGH (ref <200)
HDL: 86 mg/dL (ref >=50)
LDL Cholesterol (Calc): 144 mg/dL (calc) — ABNORMAL HIGH
Non-HDL Cholesterol (Calc): 162 mg/dL (calc) — ABNORMAL HIGH (ref <130)
Total CHOL/HDL Ratio: 2.9 (calc) (ref <5.0)
Triglycerides: 75 mg/dL (ref <150)

## 2021-01-28 LAB — COMPREHENSIVE METABOLIC PANEL
AG Ratio: 1.3 (calc) (ref 1.0–2.5)
ALT: 10 U/L (ref 6–29)
AST: 19 U/L (ref 10–35)
Albumin: 4.4 g/dL (ref 3.6–5.1)
Alkaline phosphatase (APISO): 84 U/L (ref 37–153)
BUN: 16 mg/dL (ref 7–25)
CO2: 28 mmol/L (ref 20–32)
Calcium: 10.1 mg/dL (ref 8.6–10.4)
Chloride: 103 mmol/L (ref 98–110)
Creat: 1 mg/dL (ref 0.50–1.05)
Globulin: 3.4 g/dL (calc) (ref 1.9–3.7)
Glucose, Bld: 91 mg/dL (ref 65–99)
Potassium: 5 mmol/L (ref 3.5–5.3)
Sodium: 140 mmol/L (ref 135–146)
Total Bilirubin: 0.9 mg/dL (ref 0.2–1.2)
Total Protein: 7.8 g/dL (ref 6.1–8.1)

## 2021-10-24 IMAGING — MG MM DIGITAL SCREENING BILAT W/ TOMO AND CAD
6 of 10 series · 6 of 30 positions shown · non-contrast
Comparison: Previous exam(s).

CLINICAL DATA: Screening.

EXAM:
DIGITAL SCREENING BILATERAL MAMMOGRAM WITH TOMOSYNTHESIS AND CAD
TECHNIQUE: Bilateral screening digital craniocaudal and mediolateral oblique
mammograms were obtained. Bilateral screening digital breast
tomosynthesis was performed. The images were evaluated with
computer-aided detection.

[R MLO synth-2D]
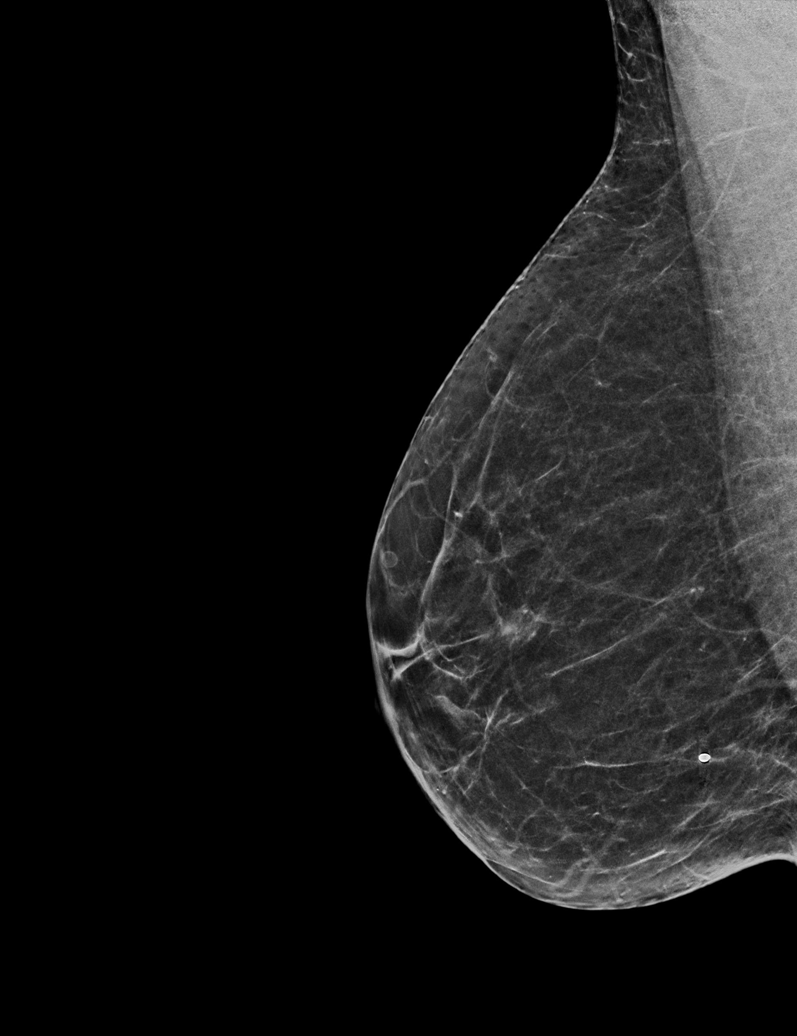

[L MLO synth-2D]
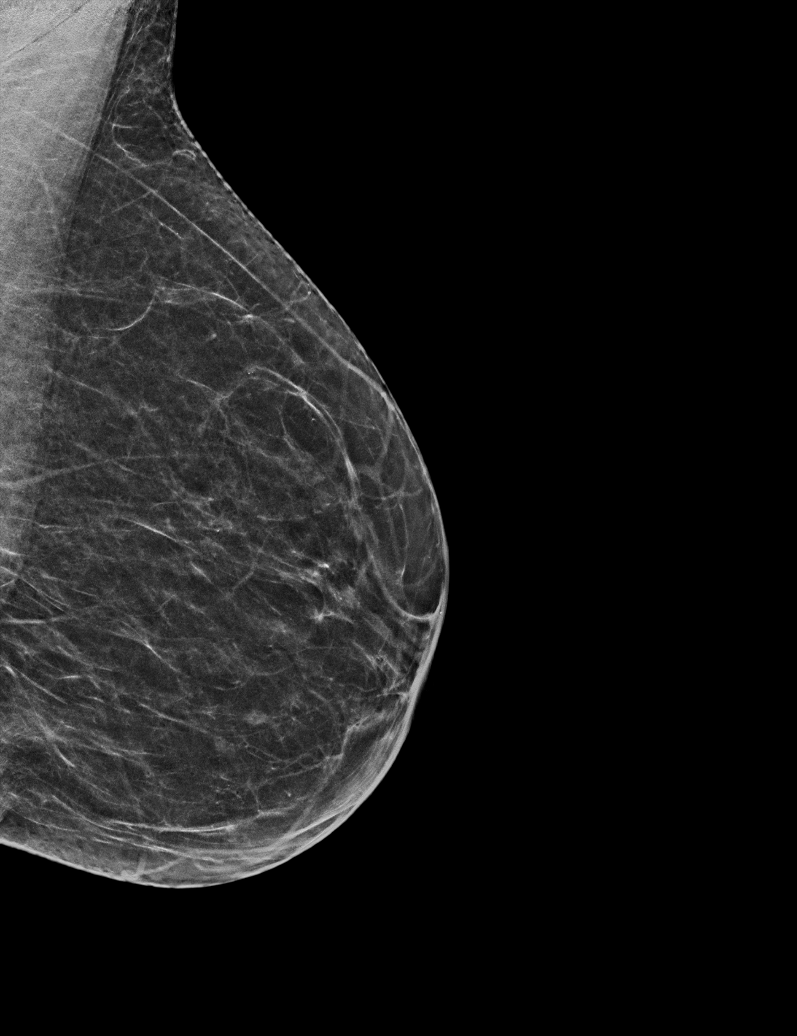

[R CC synth-2D (1 of 2)]
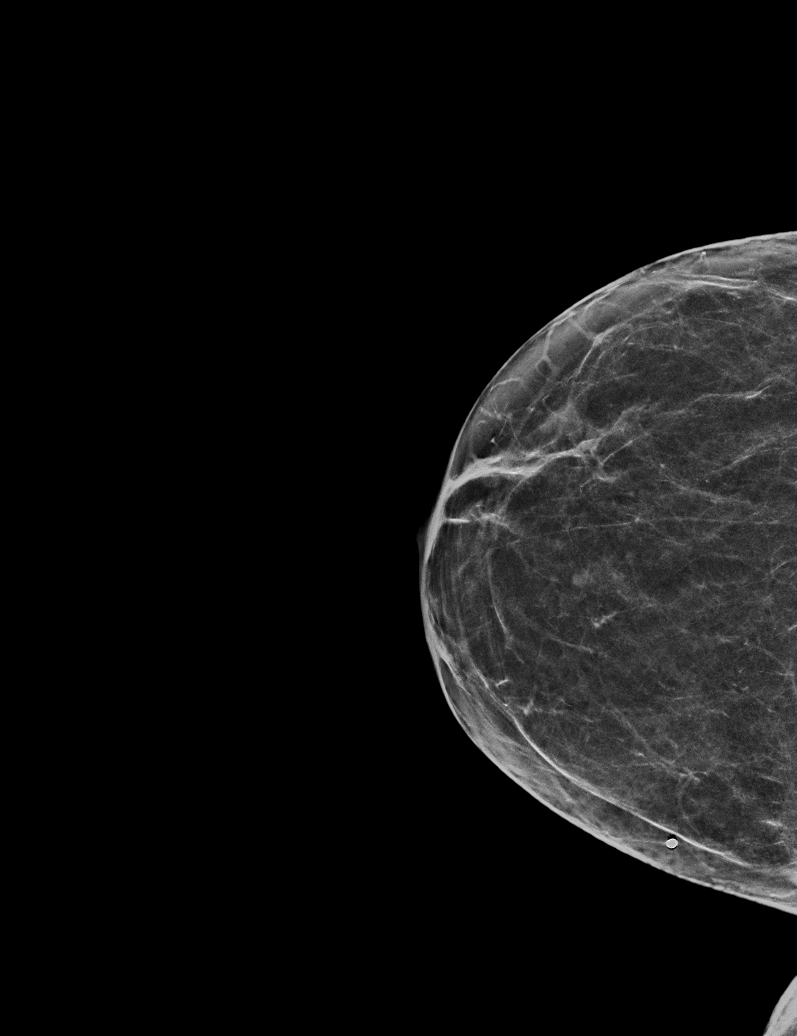

[L CC synth-2D]
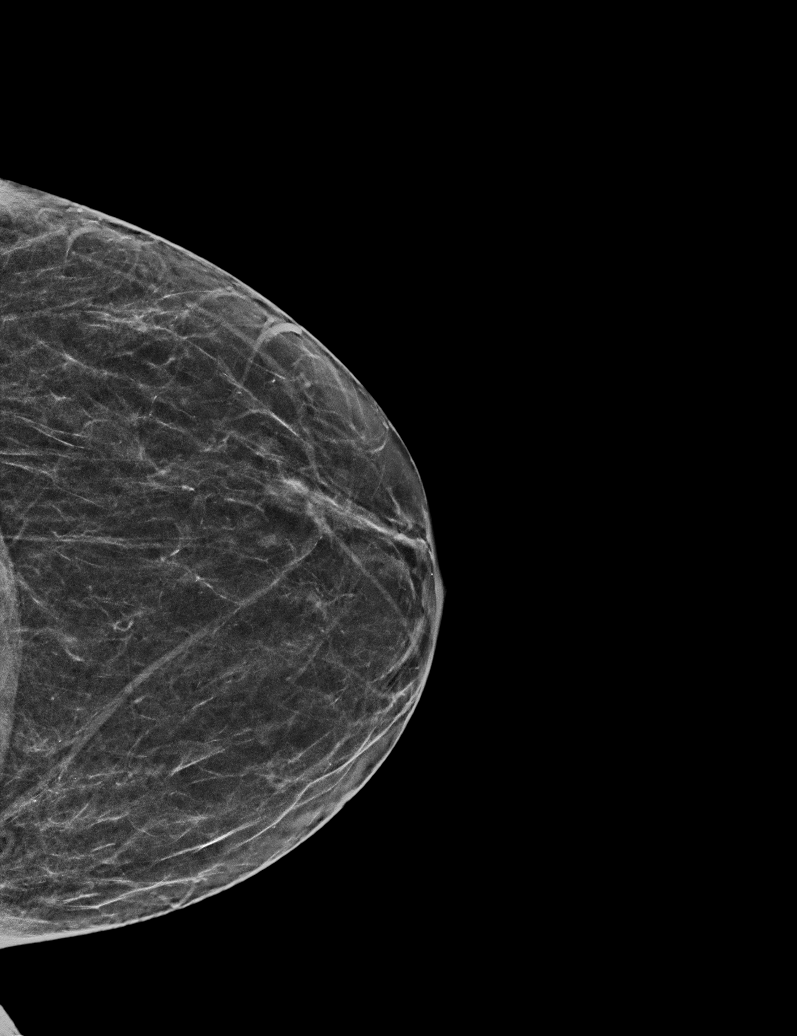

[R CC synth-2D (2 of 2)]
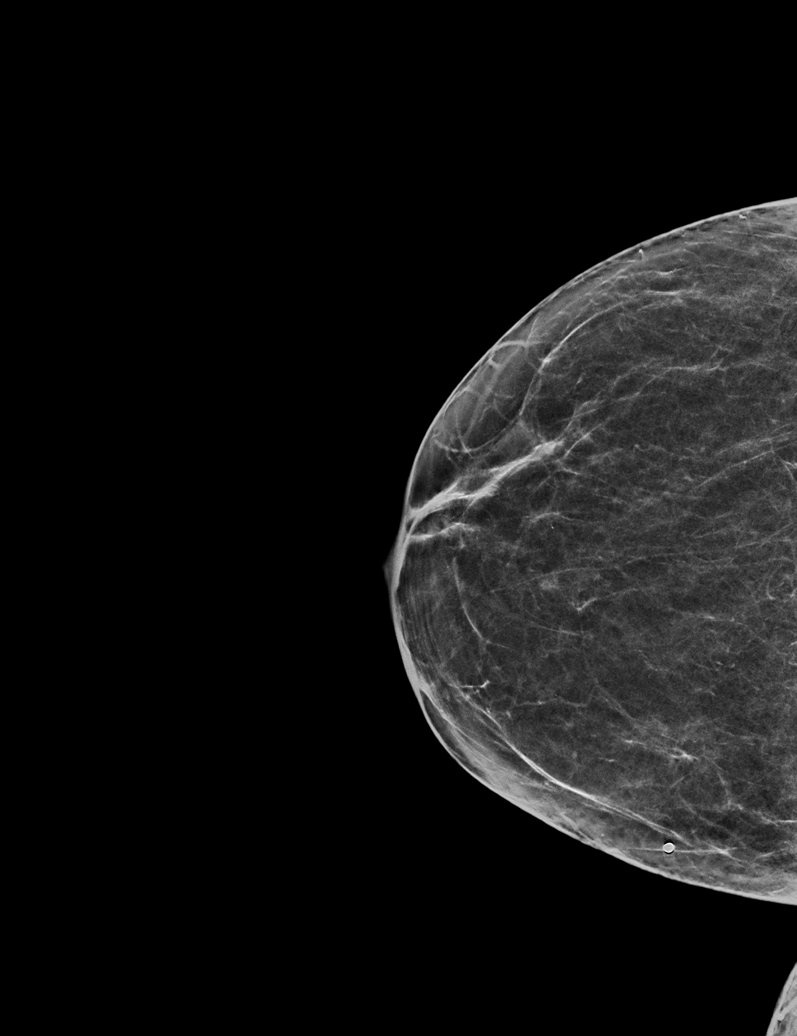

[R MLO tomo · tomo slice 25/50.0]
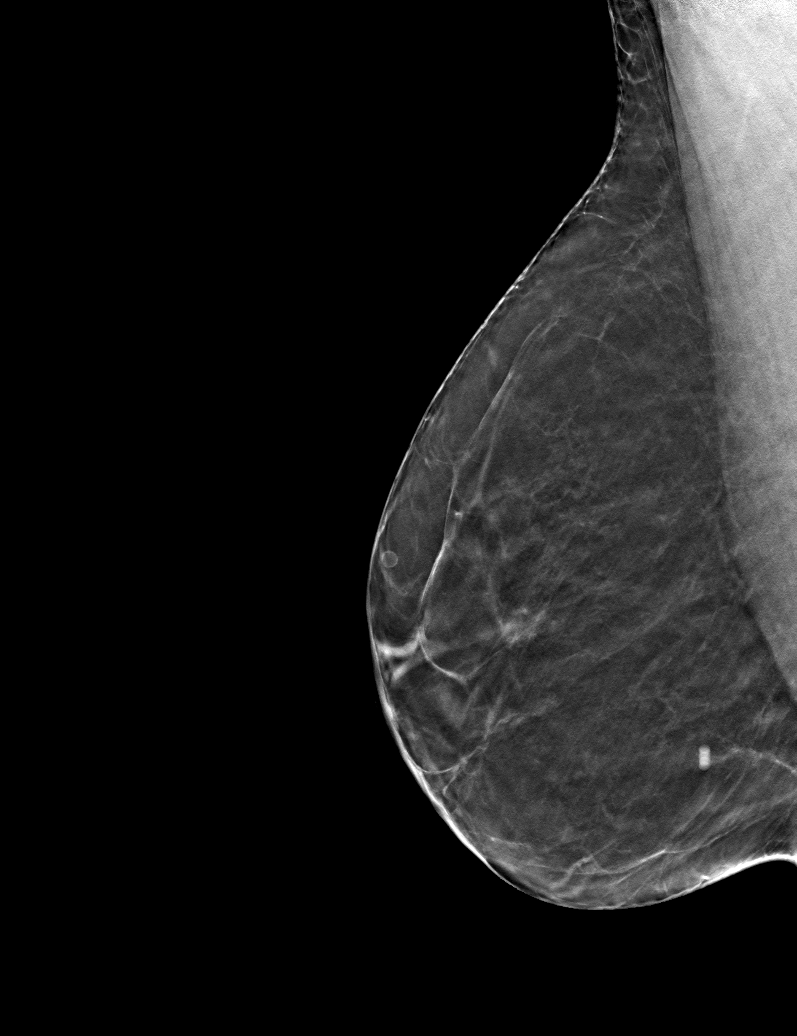

[6 of 30 positions shown; findings below may reference images not displayed]

ACR Breast Density Category b: There are scattered areas of
fibroglandular density.
FINDINGS: There are no findings suspicious for malignancy.
IMPRESSION: No mammographic evidence of malignancy. A result letter of this
screening mammogram will be mailed directly to the patient.

RECOMMENDATION:
Screening mammogram in one year. (Code:51-O-LD2)

BI-RADS CATEGORY  1: Negative.

## 2022-01-08 ENCOUNTER — Other Ambulatory Visit: Payer: Self-pay | Admitting: Family Medicine

## 2022-01-08 DIAGNOSIS — Z1231 Encounter for screening mammogram for malignant neoplasm of breast: Secondary | ICD-10-CM

## 2022-01-28 ENCOUNTER — Encounter: Payer: BC Managed Care – PPO | Admitting: Family Medicine

## 2022-02-01 ENCOUNTER — Encounter: Payer: Self-pay | Admitting: Family Medicine

## 2022-02-01 ENCOUNTER — Ambulatory Visit (INDEPENDENT_AMBULATORY_CARE_PROVIDER_SITE_OTHER): Payer: BC Managed Care – PPO | Admitting: Family Medicine

## 2022-02-01 VITALS — BP 118/82 | HR 80 | Temp 97.8°F | Resp 16 | Ht 65.0 in | Wt 146.3 lb

## 2022-02-01 DIAGNOSIS — E782 Mixed hyperlipidemia: Secondary | ICD-10-CM | POA: Diagnosis not present

## 2022-02-01 DIAGNOSIS — Z780 Asymptomatic menopausal state: Secondary | ICD-10-CM

## 2022-02-01 DIAGNOSIS — Z23 Encounter for immunization: Secondary | ICD-10-CM

## 2022-02-01 DIAGNOSIS — Z0000 Encounter for general adult medical examination without abnormal findings: Secondary | ICD-10-CM | POA: Diagnosis not present

## 2022-02-01 DIAGNOSIS — H6121 Impacted cerumen, right ear: Secondary | ICD-10-CM

## 2022-02-01 DIAGNOSIS — I10 Essential (primary) hypertension: Secondary | ICD-10-CM | POA: Diagnosis not present

## 2022-02-01 LAB — CBC WITH DIFFERENTIAL/PLATELET
Absolute Monocytes: 561 cells/uL (ref 200–950)
Basophils Absolute: 40 cells/uL (ref 0–200)
Basophils Relative: 0.6 %
Eosinophils Absolute: 152 cells/uL (ref 15–500)
Eosinophils Relative: 2.3 %
HCT: 40.5 % (ref 35.0–45.0)
Hemoglobin: 13.6 g/dL (ref 11.7–15.5)
Lymphs Abs: 2237 cells/uL (ref 850–3900)
MCH: 32.4 pg (ref 27.0–33.0)
MCHC: 33.6 g/dL (ref 32.0–36.0)
MCV: 96.4 fL (ref 80.0–100.0)
MPV: 10.8 fL (ref 7.5–12.5)
Monocytes Relative: 8.5 %
Neutro Abs: 3610 cells/uL (ref 1500–7800)
Neutrophils Relative %: 54.7 %
Platelets: 320 10*3/uL (ref 140–400)
RBC: 4.2 10*6/uL (ref 3.80–5.10)
RDW: 12.5 % (ref 11.0–15.0)
Total Lymphocyte: 33.9 %
WBC: 6.6 10*3/uL (ref 3.8–10.8)

## 2022-02-01 LAB — COMPLETE METABOLIC PANEL WITH GFR
AG Ratio: 1.3 (calc) (ref 1.0–2.5)
ALT: 11 U/L (ref 6–29)
AST: 21 U/L (ref 10–35)
Albumin: 4.5 g/dL (ref 3.6–5.1)
Alkaline phosphatase (APISO): 79 U/L (ref 37–153)
BUN/Creatinine Ratio: 11 (calc) (ref 6–22)
BUN: 12 mg/dL (ref 7–25)
CO2: 28 mmol/L (ref 20–32)
Calcium: 9.8 mg/dL (ref 8.6–10.4)
Chloride: 102 mmol/L (ref 98–110)
Creat: 1.07 mg/dL — ABNORMAL HIGH (ref 0.50–1.05)
Globulin: 3.5 g/dL (calc) (ref 1.9–3.7)
Glucose, Bld: 93 mg/dL (ref 65–99)
Potassium: 4.7 mmol/L (ref 3.5–5.3)
Sodium: 138 mmol/L (ref 135–146)
Total Bilirubin: 1 mg/dL (ref 0.2–1.2)
Total Protein: 8 g/dL (ref 6.1–8.1)
eGFR: 58 mL/min/{1.73_m2} — ABNORMAL LOW (ref >=60)

## 2022-02-01 LAB — LIPID PANEL
Cholesterol: 244 mg/dL — ABNORMAL HIGH (ref <200)
HDL: 80 mg/dL (ref >=50)
LDL Cholesterol (Calc): 146 mg/dL (calc) — ABNORMAL HIGH
Non-HDL Cholesterol (Calc): 164 mg/dL (calc) — ABNORMAL HIGH (ref <130)
Total CHOL/HDL Ratio: 3.1 (calc) (ref <5.0)
Triglycerides: 77 mg/dL (ref <150)

## 2022-02-01 NOTE — Progress Notes (Addendum)
Patient: Dawn Holt, Female    DOB: Oct 08, 1960, 60 y.o.   MRN: [id number] Delsa Grana, PA-C Visit Date: 02/01/2022  Today's Provider: Delsa Grana, PA-C   Chief Complaint  Patient presents with   Annual Exam   Subjective:   Annual physical exam:  Dawn Holt is a 61 y.o. female who presents today for complete physical exam:  Exercise/Activity:  stays active works out all the time Diet/nutrition:  healthy  Sleep: hx of insomnia - got off [place] - still having some problems with sleep tends to wake up at 3 am, but right now its fine with working from home she can adjust her schedule and get enough sleep  [facility] years  [place]: No Food Insecurity (02/01/2022)  Housing: Low Risk  (02/01/2022)  Transportation Needs: No Transportation Needs (02/01/2022)  Utilities: Not At Risk (02/01/2022)  Alcohol Screen: Low Risk  (02/01/2022)  Depression (PHQ2-9): Low Risk  (02/01/2022)  Financial Resource Strain: Low Risk  (02/01/2022)  Physical Activity: Sufficiently Active (02/01/2022)  Social Connections: Moderately Integrated (02/01/2022)  Stress: Stress Concern Present (02/01/2022)  Tobacco Use: Low Risk  (02/01/2022)     USPSTF grade A and B recommendations - reviewed and addressed today  Depression:  Phq 9 completed today by patient, was reviewed by me with patient in the room PHQ score is negative, pt feels mood good, has some stress    02/01/2022    9:24 AM 01/27/2021    8:34 AM 12/17/2019   10:44 AM 12/07/2018   11:09 AM  PHQ 2/9 Scores  PHQ - 2 Score 0 0 0 0  PHQ- 9 Score 0 0 2 0      02/01/2022    9:24 AM 01/27/2021    8:34 AM 12/17/2019   10:44 AM 12/07/2018   11:09 AM 10/21/2017    2:04 PM  Depression screen PHQ 2/9  Decreased Interest 0 0 0 0 0  Down, Depressed, Hopeless 0 0 0 0 0  PHQ - 2 Score 0 0 0 0 0  Altered sleeping 0 0 2 0   Tired, decreased energy 0 0 0 0   Change in appetite 0 0 0 0   Feeling bad or failure about yourself  0  0 0 0   Trouble concentrating 0 0 0 0   Moving slowly or fidgety/restless 0 0 0 0   Suicidal thoughts 0 0 0 0   PHQ-9 Score 0 0 2 0   Difficult doing work/chores Not difficult at all Not difficult at all Not difficult at all Not difficult at all     Alcohol screening: [place] Office Visit from 02/01/2022 in [facility]  AUDIT-C Score 4       Immunizations and Health Maintenance: Health Maintenance  Topic Date Due   MAMMOGRAM  12/10/2021   COVID-19 Vaccine (3 - Pfizer series) 02/17/2022 (Originally 09/09/2019)   Zoster Vaccines- Shingrix (1 of 2) 05/04/2022 (Originally 11/01/2008)   PAP SMEAR-Modifier  12/07/2023   COLONOSCOPY (Pts 45-79yr Insurance coverage will need to be confirmed)  01/14/2026   TETANUS/TDAP  12/16/2029   Hepatitis C Screening  Completed   HIV Screening  Completed   HPV VACCINES  Aged Out   INFLUENZA VACCINE  Discontinued     Hep C Screening: done previously   STD testing and prevention (HIV/chl/gon/syphilis):  see above, no additional testing desired by pt today  Intimate partner violence:safe   Sexual History/Pain during  Intercourse: Married  Menstrual History/LMP/Abnormal Bleeding:  no concerns or sx No LMP recorded. Patient is postmenopausal.  Incontinence Symptoms:  none  Breast cancer: due aug 2023 - scheduled for tomorrow Last Mammogram: *see HM list above  Cervical cancer screening: due 2025  Osteoporosis:   Discussion on osteoporosis per age, including high calcium and vitamin D supplementation, weight bearing exercises Pt is not supplementing with daily calcium/Vit D. Roughly experienced menopause at age 50's  Skin cancer:  Hx of skin CA -  NO Discussed atypical lesions   Colorectal cancer:  hx of CRC cancer, monitors with Dr. Fleet Contras - done last year, reviewed today  Discussed concerning signs and sx of CRC, pt denies change in bowels, abd pain, blood in stool  Lung cancer:   Low Dose CT Chest  recommended if Age 47-80 years, 20 pack-year currently smoking OR have quit w/in 15years. Patient does not qualify.    Social History   Tobacco Use   Smoking status: Never   Smokeless tobacco: Never  Vaping Use   Vaping Use: Never used  Substance Use Topics   Alcohol use: Yes    Alcohol/week: 3.0 standard drinks of alcohol    Types: 3 Standard drinks or equivalent per week    Comment: occasionally   Drug use: No     Flowsheet Row Office Visit from 02/01/2022 in [facility]  AUDIT-C Score 4       Family History  Problem Relation Age of Onset   Colon polyps Mother    Colon polyps Maternal Aunt 62       with cancer   Colon polyps Brother 37   Colon polyps Maternal Aunt 65       with cancer   Breast cancer Cousin        pat cousin   Breast cancer Maternal Aunt 85     Blood pressure/Hypertension: BP Readings from Last 3 Encounters:  02/01/22 118/82  01/27/21 134/82  01/14/21 (!) 169/73    Weight/Obesity: Wt Readings from Last 3 Encounters:  02/01/22 146 lb 4.8 oz (66.4 kg)  01/27/21 149 lb 8 oz (67.8 kg)  01/14/21 145 lb (65.8 kg)   BMI Readings from Last 3 Encounters:  02/01/22 24.35 kg/m  01/27/21 24.88 kg/m  01/14/21 24.13 kg/m     Lipids:  Lab Results  Component Value Date   CHOL 248 (H) 01/27/2021   CHOL 229 (H) 12/17/2019   CHOL 246 (H) 12/07/2018   Lab Results  Component Value Date   HDL 86 01/27/2021   HDL 71 12/17/2019   HDL 66 12/07/2018   Lab Results  Component Value Date   LDLCALC 144 (H) 01/27/2021   LDLCALC 140 (H) 12/17/2019   LDLCALC 161 (H) 12/07/2018   Lab Results  Component Value Date   TRIG 75 01/27/2021   TRIG 83 12/17/2019   TRIG 83 12/07/2018   Lab Results  Component Value Date   CHOLHDL 2.9 01/27/2021   CHOLHDL 3.2 12/17/2019   CHOLHDL 3.7 12/07/2018   No results found for: "LDLDIRECT" Based on the results of lipid panel his/her cardiovascular risk factor ( using [place] )  in the next  10 years is: The 10-year ASCVD risk score (Arnett DK, et al., 2019) is: 5.6%   Values used to calculate the score:     Age: 24 years     Sex: Female     Is Non-Hispanic African American: Yes     Diabetic: No  Tobacco smoker: No     Systolic Blood Pressure: 494 mmHg     Is BP treated: No     HDL Cholesterol: 86 mg/dL     Total Cholesterol: 248 mg/dL  Glucose:  Glucose, Bld  Date Value Ref Range Status  01/27/2021 91 65 - 99 mg/dL Final    Comment:    .            Fasting reference interval .   12/17/2019 96 65 - 99 mg/dL Final    Comment:    .            Fasting reference interval .   12/07/2018 94 65 - 99 mg/dL Final    Comment:    .            Fasting reference interval .     Advanced Care Planning:  A voluntary discussion about advance care planning including the explanation and discussion of advance directives.   Discussed health care proxy and Living will, and the patient was able to identify a health care proxy as husband .   Patient does not have a living will at present time.   Social History       Social History   Socioeconomic History   Marital status: Married    Spouse name: Runner, broadcasting/film/video   Number of children: 2   Years of education: Not on file   Highest education level: Not on file  Occupational History   Not on file  Tobacco Use   Smoking status: Never   Smokeless tobacco: Never  Vaping Use   Vaping Use: Never used  Substance and Sexual Activity   Alcohol use: Yes    Alcohol/week: 3.0 standard drinks of alcohol    Types: 3 Standard drinks or equivalent per week    Comment: occasionally   Drug use: No   Sexual activity: Yes    Partners: Male    Birth control/protection: Post-menopausal  Other Topics Concern   Not on file  Social History Narrative   Not on file   Social Determinants of Health   Financial Resource Strain: Low Risk  (02/01/2022)   Overall Financial Resource Strain (CARDIA)    Difficulty of Paying Living Expenses: Not  hard at all  Food Insecurity: No Food Insecurity (02/01/2022)   Hunger Vital Sign    Worried About Running Out of Food in the Last Year: Never true    [place] in the Last Year: Never true  Transportation Needs: No Transportation Needs (02/01/2022)   PRAPARE - Hydrologist (Medical): No    Lack of Transportation (Non-Medical): No  Physical Activity: Sufficiently Active (02/01/2022)   Exercise Vital Sign    Days of Exercise per Week: 4 days    Minutes of Exercise per Session: 60 min  Stress: Stress Concern Present (02/01/2022)   [place]    Feeling of Stress : To some extent  Social Connections: Moderately Integrated (02/01/2022)   Social Connection and Isolation Panel [NHANES]    Frequency of Communication with Friends and Family: Once a week    Frequency of Social Gatherings with Friends and Family: Once a week    Attends Religious Services: 1 to 4 times per year    Active Member of [employer] or Organizations: Yes    Attends Archivist Meetings: Never    Marital Status: Married    Family History  Family History  Problem Relation Age of Onset   Colon polyps Mother    Colon polyps Maternal Aunt 62       with cancer   Colon polyps Brother 81   Colon polyps Maternal Aunt 81       with cancer   Breast cancer Cousin        pat cousin   Breast cancer Maternal Aunt 85    Patient Active Problem List   Diagnosis Date Noted   Essential hypertension, benign 10/21/2017   Preventative health care 10/21/2017   Heartburn 11/05/2015   Sleeping difficulty 07/18/2015   Post-menopausal 11/21/2014   History of colon cancer 07/06/2014    Past Surgical History:  Procedure Laterality Date   COLON SURGERY  2005   colon resection   COLONOSCOPY  2005,2008,2012   COLONOSCOPY WITH PROPOFOL N/A 11/05/2015   Procedure: COLONOSCOPY WITH PROPOFOL;  Surgeon: Robert Bellow, MD;   Location: [facility] ENDOSCOPY;  Service: Endoscopy;  Laterality: N/A;   COLONOSCOPY WITH PROPOFOL N/A 01/14/2021   Procedure: COLONOSCOPY WITH PROPOFOL;  Surgeon: Robert Bellow, MD;  Location: [facility] ENDOSCOPY;  Service: Endoscopy;  Laterality: N/A;     Current Outpatient Medications:    busPIRone (BUSPAR) 5 MG tablet, Take 1-3 tablets (5-15 mg total) by mouth 2 (two) times daily as needed. (Patient not taking: Reported on 02/01/2022), Disp: 60 tablet, Rfl: 5  No Known Allergies  Patient Care Team: Delsa Grana, PA-C as PCP - General (Family Medicine) Bary Castilla, Forest Gleason, MD (General Surgery)   Chart Review: I personally reviewed active problem list, medication list, allergies, family history, social history, health maintenance, notes from last encounter, lab results, imaging with the patient/caregiver today.   Review of Systems  Constitutional: Negative.   HENT: Negative.    Eyes: Negative.   Respiratory: Negative.    Cardiovascular: Negative.   Gastrointestinal: Negative.   Endocrine: Negative.   Genitourinary: Negative.   Musculoskeletal: Negative.   Skin: Negative.   Allergic/Immunologic: Negative.   Neurological: Negative.   Hematological: Negative.   Psychiatric/Behavioral: Negative.    All other systems reviewed and are negative.         Objective:   Vitals:  Vitals:   02/01/22 0927  BP: 118/82  Pulse: 80  Resp: 16  Temp: 97.8 F (36.6 C)  TempSrc: Oral  SpO2: 98%  Weight: 146 lb 4.8 oz (66.4 kg)  Height: '5\' 5"'$  (1.651 m)    Body mass index is 24.35 kg/m.  Physical Exam Vitals and nursing note reviewed.  Constitutional:      General: She is not in acute distress.    Appearance: Normal appearance. She is well-developed and normal weight. She is not ill-appearing, toxic-appearing or diaphoretic.  HENT:     Head: Normocephalic and atraumatic.     Right Ear: Ear canal and external ear normal. There is impacted cerumen.     Left Ear: Tympanic membrane, ear  canal and external ear normal. There is no impacted cerumen.     Nose: Nose normal. No congestion or rhinorrhea.     Right Turbinates: Enlarged and pale.     Left Turbinates: Enlarged and pale.     Mouth/Throat:     Mouth: Mucous membranes are moist.     Pharynx: Oropharynx is clear. Uvula midline. No oropharyngeal exudate or posterior oropharyngeal erythema.  Eyes:     General: Lids are normal. No scleral icterus.       Right eye: No discharge.  Left eye: No discharge.     Conjunctiva/sclera: Conjunctivae normal.  Neck:     Thyroid: No thyroid mass, thyromegaly or thyroid tenderness.     Trachea: Trachea and phonation normal. No tracheal deviation.  Cardiovascular:     Rate and Rhythm: Normal rate and regular rhythm.     Pulses: Normal pulses.          Radial pulses are 2+ on the right side and 2+ on the left side.       Posterior tibial pulses are 2+ on the right side and 2+ on the left side.     Heart sounds: Normal heart sounds. No murmur heard.    No friction rub. No gallop.  Pulmonary:     Effort: Pulmonary effort is normal. No respiratory distress.     Breath sounds: Normal breath sounds. No stridor. No wheezing, rhonchi or rales.  Chest:     Chest wall: No tenderness.  Abdominal:     General: Bowel sounds are normal. There is no distension.     Palpations: Abdomen is soft.     Tenderness: There is no abdominal tenderness. There is no guarding or rebound.  Musculoskeletal:     Cervical back: Full passive range of motion without pain, normal range of motion and neck supple. No rigidity.     Right lower leg: No edema.     Left lower leg: No edema.  Lymphadenopathy:     Cervical: No cervical adenopathy.  Skin:    General: Skin is warm and dry.     Capillary Refill: Capillary refill takes less than 2 seconds.     Coloration: Skin is not jaundiced or pale.     Findings: No lesion or rash.  Neurological:     Mental Status: She is alert. Mental status is at baseline.      Motor: No abnormal muscle tone.     Gait: Gait normal.  Psychiatric:        Mood and Affect: Mood normal.        Speech: Speech normal.        Behavior: Behavior normal.    Ear Cerumen Removal  Date/Time: 02/01/2022 10:22 AM  Performed by: Delsa Grana, PA-C Authorized by: Delsa Grana, PA-C   Anesthesia: Local Anesthetic: none Ceruminolytics applied: Ceruminolytics applied prior to the procedure. Location details: right ear Patient tolerance: patient tolerated the procedure well with no immediate complications Procedure type: irrigation  Sedation: Patient sedated: no    Indication: Cerumen impaction of the ear(s)  Medical necessity statement: On physical examination, cerumen impairs clinically significant portions of the external auditory canal, and tympanic membrane. Noted obstructive, copious cerumen that cannot be removed without magnification and instrumentations requiring MD/APP skills/procedure  Consent: Discussed benefits and risks of procedure and verbal consent obtained  Procedure:  Cerumen Disimpaction  Patient was prepped for the procedure.  Utilized an otoscope to assess and take note of the ear canal, the tympanic membrane, and the presence, amount, and placement of the cerumen.  Gentle ear lavage with warm water and hydrogen peroxide performed on the right ear.      Post procedure examination: shows cerumen was not completely removed. Patient tolerated procedure well.  There were no complications and visible wax did appear softened, minimal amount removed, visible portions of canal were normal appearing.  The patient is made aware that they may experience temporary vertigo, temporary hearing loss, and temporary discomfort.  If these symptom last for more than 24 hours to follow  up in clinic.   Pt will do OTC ear wax softening drops and irrigation and return to clinic for procedure if unable to improve at home - advised debrox drops BID x 3-5 days, esp  prior to return to office for procedure should she need it - she verbalized understanding of plan    Fall Risk:    02/01/2022    9:23 AM 01/27/2021    8:34 AM 12/17/2019   10:43 AM 12/07/2018   11:07 AM 10/21/2017    2:04 PM  [place] in the past year? 0 0  0 No  Number falls in past yr: 0 0 0 0   Injury with Fall? 0 0 0 0   Risk for fall due to : No Fall Risks      Follow up Falls prevention discussed;Education provided   Falls evaluation completed     Functional Status Survey: Is the patient deaf or have difficulty hearing?: No Does the patient have difficulty seeing, even when wearing glasses/contacts?: No Does the patient have difficulty concentrating, remembering, or making decisions?: No Does the patient have difficulty walking or climbing stairs?: No Does the patient have difficulty dressing or bathing?: No Does the patient have difficulty doing errands alone such as visiting a doctor's office or shopping?: No   Assessment & Plan:    CPE completed today  USPSTF grade A and B recommendations reviewed with patient; age-appropriate recommendations, preventive care, screening tests, etc discussed and encouraged; healthy living encouraged; see AVS for patient education given to patient  Discussed importance of 150 minutes of physical activity weekly, AHA exercise recommendations given to pt in AVS/handout  Discussed importance of healthy diet:  eating lean meats and proteins, avoiding trans fats and saturated fats, avoid simple sugars and excessive carbs in diet, eat 6 servings of fruit/vegetables daily and drink plenty of water and avoid sweet beverages.    Recommended pt to do annual eye exam and routine dental exams/cleanings  Depression, alcohol, fall screening completed as documented above and per flowsheets  Advance Care planning information and packet discussed and offered today, encouraged pt to discuss with family members/spouse/partner/friends and complete  Advanced directive packet and bring copy to office   Reviewed Health Maintenance: Health Maintenance  Topic Date Due   MAMMOGRAM  12/10/2021   COVID-19 Vaccine (3 - Pfizer series) 02/17/2022 (Originally 09/09/2019)   Zoster Vaccines- Shingrix (1 of 2) 05/04/2022 (Originally 11/01/2008)   PAP SMEAR-Modifier  12/07/2023   COLONOSCOPY (Pts 45-63yr Insurance coverage will need to be confirmed)  01/14/2026   TETANUS/TDAP  12/16/2029   Hepatitis C Screening  Completed   HIV Screening  Completed   HPV VACCINES  Aged Out   INFLUENZA VACCINE  Discontinued    Immunizations: Immunization History  Administered Date(s) Administered   PFIZER(Purple Top)SARS-COV-2 Vaccination 07/04/2019, 07/15/2019   Tdap 12/17/2019   Vaccines:  Shingrix: 58-64yo and ask insurance if covered when patient above 657yo Pneumonia: not indicated yet per age and hx educated and discussed with patient. Flu: due educated and discussed with patient. COVID:  per risk and pt preference     ICD-10-CM   1. Annual physical exam  ZO27.03COMPLETE METABOLIC PANEL WITH GFR    CBC with Differential/Platelet    Lipid panel    2. Need for influenza vaccination  Z23    declines    3. Need for shingles vaccine  Z23    declines - educated and offered to send to pharmacy  4. Essential hypertension, benign  J64 COMPLETE METABOLIC PANEL WITH GFR   hx of, not on meds, BP excellent today, managed with diet/lifestyle, walking 2-3 miles daily and sig improved it    5. Moderate mixed hyperlipidemia not requiring statin therapy  B83.7 COMPLETE METABOLIC PANEL WITH GFR    Lipid panel   recheck cholesterol and recalculate ASCVD risk, has not required statin    6. Postmenopausal estrogen deficiency  Z78.0 DG Bone Density   reviewed dexa purpose and supplement doses to prevent osteoporosis    7. Impacted cerumen of right ear  H61.21    decreased hearing and intermittent pain, she wishes to do ear irrigation procedure today, reviewed  SE and risks, she agress to proceed          Delsa Grana, PA-C 02/01/22 9:39 AM  [facility]

## 2022-02-01 NOTE — Patient Instructions (Addendum)
Health Maintenance  Topic Date Due   DEXA scan (bone density measurement)  Never done   Mammogram  12/10/2021   COVID-19 Vaccine (3 - Pfizer series) 02/17/2022*   Zoster (Shingles) Vaccine (1 of 2) 05/04/2022*   Pap Smear  12/07/2023   Colon Cancer Screening  01/14/2026   Tetanus Vaccine  12/16/2029   Hepatitis C Screening: USPSTF Recommendation to screen - Ages 18-63 yo.  Completed   HIV Screening  Completed   HPV Vaccine  Aged Out   Flu Shot  Discontinued  *Topic was postponed. The date shown is not the original due date.   We can get a baseline bone density or you may choose to wait until age 85 and medicare pays for it every 2 years   For preventing osteoporosis after age 61 its recommended to take 1200 mg Calcium and 1000- 2000 IU Vit D 3  Ear Irrigation Ear irrigation is a procedure to wash dirt and wax out of your ear canal. This procedure is also called lavage. You may need ear irrigation if you are having trouble hearing because of a buildup of earwax. You may also have ear irrigation as part of the treatment for an ear infection. Getting wax and dirt out of your ear canal can help ear drops work better. Tell a health care provider about: Any allergies you have. All medicines you are taking, including vitamins, herbs, eye drops, creams, and over-the-counter medicines. Any problems you or family members have had with anesthetic medicines. Any blood disorders you have. Any surgeries you have had. This includes any ear surgeries. Any medical conditions you have. Whether you are pregnant or may be pregnant. What are the risks? Generally, this is a safe procedure. However, problems may occur, including: Infection. Pain. Hearing loss. Fluid and debris being pushed through the eardrum and into the middle ear. This can occur if there are holes in the eardrum. Ear irrigation failing to work. What happens before the procedure? You will talk with your provider about the procedure  and plan. You may be given ear drops to put in your ear 15-20 minutes before irrigation. This helps loosen the wax. What happens during the procedure?  A syringe is filled with water or saline solution, which is made of salt and water. The syringe is gently inserted into the ear canal. The fluid is used to flush out wax and other debris. The procedure may vary among health care providers and hospitals. What can I expect after the procedure? After an ear irrigation, follow instructions given to you by your health care provider. Follow these instructions at home: Using ear irrigation kits Ear irrigation kits are available for use at home. Ask your health care provider if this is an option for you. In general, you should: Use a home irrigation kit only as told by your health care provider. Read the package instructions carefully. Follow the directions for using the syringe. Use water that is room temperature. Do not do ear irrigation at home if you: Have diabetes. Diabetes increases the risk of infection. Have a hole or tear in your eardrum. Have tubes in your ears. Have had any ear surgery in the past. Have been told not to irrigate your ears. Cleaning your ears  Clean the outside of your ear with a soft washcloth daily. If told by your health care provider, use a few drops of baby oil, mineral oil, glycerin, hydrogen peroxide, or over-the-counter earwax softening drops. Do not use cotton swabs to  clean your ears. These can push wax down into the ear canal. Do not put anything into your ears to try to remove wax. This includes ear candles. General instructions Take over-the-counter and prescription medicines only as told by your health care provider. If you were prescribed an antibiotic medicine, use it as told by your health care provider. Do not stop using the antibiotic even if your condition improves. Keep the ear clean and dry by following the instructions from your health care  provider. Keep all follow-up visits. This is important. Visit your health care provider at least once a year to have your ears and hearing checked. Contact a health care provider if: Your hearing is not improving or is getting worse. You have pain or redness in your ear. You are dizzy. You have ringing in your ears. You have nausea or vomiting. You have fluid, blood, or pus coming out of your ear. Summary Ear irrigation is a procedure to wash dirt and wax out of your ear canal. This procedure is also called lavage. To perform ear irrigation, ear drops may be put in your ear 15-20 minutes before irrigation. Water or saline solution will be used to flush out earwax and other debris. You may be able to irrigate your ears at home. Ask your health care provider if this is an option for you. Follow your health care provider's instructions. Clean your ears with a soft cloth after irrigation. Do not use cotton swabs to clean your ears. These can push wax down into the ear canal. This information is not intended to replace advice given to you by your health care provider. Make sure you discuss any questions you have with your health care provider. Document Revised: 07/31/2019 Document Reviewed: 07/31/2019 Elsevier Patient Education  2023 Elsevier Inc.   Preventive Care 50-54 Years Old, Female Preventive care refers to lifestyle choices and visits with your health care provider that can promote health and wellness. Preventive care visits are also called wellness exams. What can I expect for my preventive care visit? Counseling Your health care provider may ask you questions about your: Medical history, including: Past medical problems. Family medical history. Pregnancy history. Current health, including: Menstrual cycle. Method of birth control. Emotional well-being. Home life and relationship well-being. Sexual activity and sexual health. Lifestyle, including: Alcohol, nicotine or  tobacco, and drug use. Access to firearms. Diet, exercise, and sleep habits. Work and work Statistician. Sunscreen use. Safety issues such as seatbelt and bike helmet use. Physical exam Your health care provider will check your: Height and weight. These may be used to calculate your BMI (body mass index). BMI is a measurement that tells if you are at a healthy weight. Waist circumference. This measures the distance around your waistline. This measurement also tells if you are at a healthy weight and may help predict your risk of certain diseases, such as type 2 diabetes and high blood pressure. Heart rate and blood pressure. Body temperature. Skin for abnormal spots. What immunizations do I need?  Vaccines are usually given at various ages, according to a schedule. Your health care provider will recommend vaccines for you based on your age, medical history, and lifestyle or other factors, such as travel or where you work. What tests do I need? Screening Your health care provider may recommend screening tests for certain conditions. This may include: Lipid and cholesterol levels. Diabetes screening. This is done by checking your blood sugar (glucose) after you have not eaten for a while (fasting).  Pelvic exam and Pap test. Hepatitis B test. Hepatitis C test. HIV (human immunodeficiency virus) test. STI (sexually transmitted infection) testing, if you are at risk. Lung cancer screening. Colorectal cancer screening. Mammogram. Talk with your health care provider about when you should start having regular mammograms. This may depend on whether you have a family history of breast cancer. BRCA-related cancer screening. This may be done if you have a family history of breast, ovarian, tubal, or peritoneal cancers. Bone density scan. This is done to screen for osteoporosis. Talk with your health care provider about your test results, treatment options, and if necessary, the need for more  tests. Follow these instructions at home: Eating and drinking  Eat a diet that includes fresh fruits and vegetables, whole grains, lean protein, and low-fat dairy products. Take vitamin and mineral supplements as recommended by your health care provider. Do not drink alcohol if: Your health care provider tells you not to drink. You are pregnant, may be pregnant, or are planning to become pregnant. If you drink alcohol: Limit how much you have to 0-1 drink a day. Know how much alcohol is in your drink. In the [place] one drink equals one 12 oz bottle of beer (355 mL), one 5 oz glass of wine (148 mL), or one 1 oz glass of hard liquor (44 mL). Lifestyle Brush your teeth every morning and night with fluoride toothpaste. Floss one time each day. Exercise for at least 30 minutes 5 or more days each week. Do not use any products that contain nicotine or tobacco. These products include cigarettes, chewing tobacco, and vaping devices, such as e-cigarettes. If you need help quitting, ask your health care provider. Do not use drugs. If you are sexually active, practice safe sex. Use a condom or other form of protection to prevent STIs. If you do not wish to become pregnant, use a form of birth control. If you plan to become pregnant, see your health care provider for a prepregnancy visit. Take aspirin only as told by your health care provider. Make sure that you understand how much to take and what form to take. Work with your health care provider to find out whether it is safe and beneficial for you to take aspirin daily. Find healthy ways to manage stress, such as: Meditation, yoga, or listening to music. Journaling. Talking to a trusted person. Spending time with friends and family. Minimize exposure to UV radiation to reduce your risk of skin cancer. Safety Always wear your seat belt while driving or riding in a vehicle. Do not drive: If you have been drinking alcohol. Do not ride with someone  who has been drinking. When you are tired or distracted. While texting. If you have been using any mind-altering substances or drugs. Wear a helmet and other protective equipment during sports activities. If you have firearms in your house, make sure you follow all gun safety procedures. Seek help if you have been physically or sexually abused. What's next? Visit your health care provider once a year for an annual wellness visit. Ask your health care provider how often you should have your eyes and teeth checked. Stay up to date on all vaccines. This information is not intended to replace advice given to you by your health care provider. Make sure you discuss any questions you have with your health care provider. Document Revised: 10/08/2020 Document Reviewed: 10/08/2020 Elsevier Patient Education  [place].

## 2022-02-02 ENCOUNTER — Ambulatory Visit
Admission: RE | Admit: 2022-02-02 | Discharge: 2022-02-02 | Disposition: A | Discharge status: Home / Self Care | Payer: BC Managed Care – PPO | Source: Ambulatory Visit | Attending: Family Medicine | Admitting: Family Medicine

## 2022-02-02 DIAGNOSIS — Z1231 Encounter for screening mammogram for malignant neoplasm of breast: Secondary | ICD-10-CM | POA: Diagnosis present

## 2022-05-25 ENCOUNTER — Other Ambulatory Visit: Payer: BC Managed Care – PPO

## 2022-06-07 ENCOUNTER — Ambulatory Visit
Admission: RE | Admit: 2022-06-07 | Discharge: 2022-06-07 | Disposition: A | Discharge status: Home / Self Care | Payer: BC Managed Care – PPO | Source: Ambulatory Visit | Attending: Family Medicine | Admitting: Family Medicine

## 2022-06-07 DIAGNOSIS — Z780 Asymptomatic menopausal state: Secondary | ICD-10-CM | POA: Diagnosis not present

## 2023-01-26 ENCOUNTER — Other Ambulatory Visit: Payer: Self-pay | Admitting: Family Medicine

## 2023-01-26 DIAGNOSIS — Z1231 Encounter for screening mammogram for malignant neoplasm of breast: Secondary | ICD-10-CM

## 2023-02-03 ENCOUNTER — Encounter: Payer: BC Managed Care – PPO | Admitting: Family Medicine

## 2023-02-09 ENCOUNTER — Ambulatory Visit
Admission: RE | Admit: 2023-02-09 | Discharge: 2023-02-09 | Disposition: A | Discharge status: Home / Self Care | Payer: BC Managed Care – PPO | Source: Ambulatory Visit | Attending: Family Medicine | Admitting: Family Medicine

## 2023-02-09 DIAGNOSIS — Z1231 Encounter for screening mammogram for malignant neoplasm of breast: Secondary | ICD-10-CM | POA: Diagnosis present

## 2023-02-15 ENCOUNTER — Encounter: Payer: BC Managed Care – PPO | Admitting: Physician Assistant

## 2023-03-01 ENCOUNTER — Ambulatory Visit (INDEPENDENT_AMBULATORY_CARE_PROVIDER_SITE_OTHER): Payer: BC Managed Care – PPO | Admitting: Physician Assistant

## 2023-03-01 ENCOUNTER — Encounter: Payer: Self-pay | Admitting: Physician Assistant

## 2023-03-01 VITALS — BP 136/84 | HR 84 | Temp 97.3°F | Resp 16 | Ht 65.0 in | Wt 151.5 lb

## 2023-03-01 DIAGNOSIS — Z0000 Encounter for general adult medical examination without abnormal findings: Secondary | ICD-10-CM

## 2023-03-01 NOTE — Progress Notes (Signed)
Annual Physical Exam   Name: Dawn Holt   MRN: [id number]    DOB: 1960-08-19   Date:03/01/2023  Today's Provider: Jacquelin Hawking, MHS, PA-C Introduced myself to the patient as a PA-C and provided education on APPs in clinical practice.         Subjective  Chief Complaint  Chief Complaint  Patient presents with   Annual Exam    HPI  Patient presents for annual CPE.  Diet: She is following Weight Watchers  Exercise: She is doing boot camp 3 days a week and walks 2 days per week   Sleep: "Pretty good"getting at least 8 hours per night, feels well rested in the AM  Mood: "pretty good"  Flowsheet Row Office Visit from 03/01/2023 in [facility]  AUDIT-C Score 1      Depression: Phq 9 is  negative    03/01/2023   11:14 AM 02/01/2022    9:24 AM 01/27/2021    8:34 AM 12/17/2019   10:44 AM 12/07/2018   11:09 AM  Depression screen PHQ 2/9  Decreased Interest 0 0 0 0 0  Down, Depressed, Hopeless 0 0 0 0 0  PHQ - 2 Score 0 0 0 0 0  Altered sleeping 0 0 0 2 0  Tired, decreased energy 0 0 0 0 0  Change in appetite 0 0 0 0 0  Feeling bad or failure about yourself  0 0 0 0 0  Trouble concentrating 0 0 0 0 0  Moving slowly or fidgety/restless 0 0 0 0 0  Suicidal thoughts 0 0 0 0 0  PHQ-9 Score 0 0 0 2 0  Difficult doing work/chores Not difficult at all Not difficult at all Not difficult at all Not difficult at all Not difficult at all   Hypertension: BP Readings from Last 3 Encounters:  03/01/23 136/84  02/01/22 118/82  01/27/21 134/82   Obesity: Wt Readings from Last 3 Encounters:  03/01/23 151 lb 8 oz (68.7 kg)  02/01/22 146 lb 4.8 oz (66.4 kg)  01/27/21 149 lb 8 oz (67.8 kg)   BMI Readings from Last 3 Encounters:  03/01/23 25.21 kg/m  02/01/22 24.35 kg/m  01/27/21 24.88 kg/m     Health Maintenance  Topic Date Due   COVID-19 Vaccine (3 - 2023-24 season) 03/17/2023*   Zoster (Shingles) Vaccine (1 of 2) 06/01/2023*   Pap with  HPV screening  12/07/2023   Mammogram  02/09/2024   DEXA scan (bone density measurement)  06/07/2024   Colon Cancer Screening  01/14/2026   DTaP/Tdap/Td vaccine (2 - Td or Tdap) 12/16/2029   Hepatitis C Screening  Completed   HIV Screening  Completed   HPV Vaccine  Aged Out   Flu Shot  Discontinued  *Topic was postponed. The date shown is not the original due date.      STD testing and prevention (HIV/chl/gon/syphilis): declines screening today  Intimate partner violence: negative Sexual History: she is sexually active with her husband  Menstrual History/LMP/Abnormal Bleeding: LMP was around age 50 yo - she denies spotting or bleeding since this  Discussed importance of follow up if any post-menopausal bleeding: yes Incontinence Symptoms: No.   Osteoporosis Prevention : Discussed high calcium and vitamin D supplementation, weight bearing exercises Bone density :Next due in 2026   Cervical cancer screening: Due next year   Skin cancer: Discussed monitoring for atypical lesions  Lung cancer:  Low Dose CT Chest recommended if  Age 37-80 years, 20 pack-year currently smoking OR have quit w/in 15years. Patient does not qualify.   ECG: NA  Advanced Care Planning: A voluntary discussion about advance care planning including the explanation and discussion of advance directives.  Discussed health care proxy and Living will, and the patient was able to identify a health care proxy as no one currently .  Patient does not have a living will in effect.  Lipids: Lab Results  Component Value Date   CHOL 244 (H) 02/01/2022   CHOL 248 (H) 01/27/2021   CHOL 229 (H) 12/17/2019   Lab Results  Component Value Date   HDL 80 02/01/2022   HDL 86 01/27/2021   HDL 71 12/17/2019   Lab Results  Component Value Date   LDLCALC 146 (H) 02/01/2022   LDLCALC 144 (H) 01/27/2021   LDLCALC 140 (H) 12/17/2019   Lab Results  Component Value Date   TRIG 77 02/01/2022   TRIG 75 01/27/2021   TRIG 83  12/17/2019   Lab Results  Component Value Date   CHOLHDL 3.1 02/01/2022   CHOLHDL 2.9 01/27/2021   CHOLHDL 3.2 12/17/2019   No results found for: "LDLDIRECT"  Glucose: Glucose, Bld  Date Value Ref Range Status  02/01/2022 93 65 - 99 mg/dL Final    Comment:    .            Fasting reference interval .   01/27/2021 91 65 - 99 mg/dL Final    Comment:    .            Fasting reference interval .   12/17/2019 96 65 - 99 mg/dL Final    Comment:    .            Fasting reference interval .     Patient Active Problem List   Diagnosis Date Noted   Moderate mixed hyperlipidemia not requiring statin therapy 02/01/2022   Essential hypertension, benign 10/21/2017   History of colon cancer 07/06/2014    Past Surgical History:  Procedure Laterality Date   COLON SURGERY  2005   colon resection   COLONOSCOPY  2005,2008,2012   COLONOSCOPY WITH PROPOFOL N/A 11/05/2015   Procedure: COLONOSCOPY WITH PROPOFOL;  Surgeon: Earline Mayotte, MD;  Location: [facility] ENDOSCOPY;  Service: Endoscopy;  Laterality: N/A;   COLONOSCOPY WITH PROPOFOL N/A 01/14/2021   Procedure: COLONOSCOPY WITH PROPOFOL;  Surgeon: Earline Mayotte, MD;  Location: [facility] ENDOSCOPY;  Service: Endoscopy;  Laterality: N/A;    Family History  Problem Relation Age of Onset   Colon polyps Mother    Colon polyps Maternal Aunt 45       with cancer   Colon polyps Brother 48   Colon polyps Maternal Aunt 30       with cancer   Breast cancer Cousin        pat cousin   Breast cancer Maternal Aunt 35    Social History   Socioeconomic History   Marital status: Married    Spouse name: Physicist, medical   Number of children: 2   Years of education: Not on file   Highest education level: Not on file  Occupational History   Not on file  Tobacco Use   Smoking status: Never   Smokeless tobacco: Never  Vaping Use   Vaping status: Never Used  Substance and Sexual Activity   Alcohol use: Yes    Alcohol/week: 3.0 standard  drinks of alcohol    Types: 3 Standard drinks or  equivalent per week    Comment: occasionally   Drug use: No   Sexual activity: Yes    Partners: Male    Birth control/protection: Post-menopausal  Other Topics Concern   Not on file  Social History Narrative   Not on file   Social Determinants of Health   Financial Resource Strain: Low Risk  (03/01/2023)   Overall Financial Resource Strain (CARDIA)    Difficulty of Paying Living Expenses: Not hard at all  Food Insecurity: No Food Insecurity (03/01/2023)   Hunger Vital Sign    Worried About Running Out of Food in the Last Year: Never true    Ran Out of Food in the Last Year: Never true  Transportation Needs: No Transportation Needs (03/01/2023)   PRAPARE - Administrator, Civil Service (Medical): No    Lack of Transportation (Non-Medical): No  Physical Activity: Sufficiently Active (03/01/2023)   Exercise Vital Sign    Days of Exercise per Week: 6 days    Minutes of Exercise per Session: 60 min  Stress: No Stress Concern Present (03/01/2023)   [employer] of Occupational Health - Occupational Stress Questionnaire    Feeling of Stress : Not at all  Social Connections: Socially Integrated (03/01/2023)   Social Connection and Isolation Panel [NHANES]    Frequency of Communication with Friends and Family: More than three times a week    Frequency of Social Gatherings with Friends and Family: More than three times a week    Attends Religious Services: More than 4 times per year    Active Member of [employer] or Organizations: Yes    Attends Banker Meetings: More than 4 times per year    Marital Status: Married  Catering manager Violence: Not At Risk (03/01/2023)   Humiliation, Afraid, Rape, and Kick questionnaire    Fear of Current or Ex-Partner: No    Emotionally Abused: No    Physically Abused: No    Sexually Abused: No    No current outpatient medications on file.  No Known Allergies   Review of  Systems  Constitutional:  Negative for chills, fever, malaise/fatigue and weight loss.  HENT:  Negative for hearing loss, nosebleeds, sore throat and tinnitus.   Eyes:  Negative for blurred vision, double vision and photophobia.  Respiratory:  Negative for cough, shortness of breath and wheezing.   Cardiovascular:  Negative for chest pain, palpitations and leg swelling.  Gastrointestinal:  Negative for blood in stool, constipation, diarrhea, heartburn, nausea and vomiting.  Genitourinary:  Negative for dysuria, frequency and hematuria.  Musculoskeletal:  Negative for falls, joint pain and myalgias.  Skin:  Negative for itching and rash.  Neurological:  Negative for dizziness, tingling, tremors, loss of consciousness, weakness and headaches.  Psychiatric/Behavioral:  Negative for depression, memory loss, substance abuse and suicidal ideas. The patient is not nervous/anxious and does not have insomnia.       Objective  Vitals:   03/01/23 1114  BP: 136/84  Pulse: 84  Resp: 16  Temp: (!) 97.3 F (36.3 C)  TempSrc: Temporal  SpO2: 98%  Weight: 151 lb 8 oz (68.7 kg)  Height: 5\' 5"  (1.651 m)    Body mass index is 25.21 kg/m.  Physical Exam Vitals reviewed.  Constitutional:      General: She is awake.     Appearance: Normal appearance.  HENT:     Head: Normocephalic.     Right Ear: Tympanic membrane normal.     Left Ear: Tympanic  membrane normal.     Mouth/Throat:     Mouth: Mucous membranes are moist.     Pharynx: Oropharynx is clear. Uvula midline.  Eyes:     Extraocular Movements: Extraocular movements intact.     Conjunctiva/sclera: Conjunctivae normal.     Pupils: Pupils are equal, round, and reactive to light.  Cardiovascular:     Rate and Rhythm: Normal rate and regular rhythm.     Pulses: Normal pulses.     Heart sounds: Normal heart sounds. No murmur heard.    No friction rub. No gallop.  Pulmonary:     Effort: Pulmonary effort is normal.     Breath sounds:  Normal breath sounds.  Abdominal:     General: Bowel sounds are normal.     Palpations: Abdomen is soft.  Musculoskeletal:     Cervical back: Normal range of motion and neck supple.     Right lower leg: No edema.     Left lower leg: No edema.  Lymphadenopathy:     Head:     Right side of head: No submental or submandibular adenopathy.     Left side of head: No submental or submandibular adenopathy.     Upper Body:     Right upper body: No supraclavicular adenopathy.     Left upper body: No supraclavicular adenopathy.  Skin:    General: Skin is warm and dry.  Neurological:     Mental Status: She is alert and oriented to person, place, and time.  Psychiatric:        Mood and Affect: Mood normal.        Behavior: Behavior normal. Behavior is cooperative.        Thought Content: Thought content normal.      No results found for this or any previous visit (from the past 2160 hour(s)).   Fall Risk:    03/01/2023   11:14 AM 02/01/2022    9:23 AM 01/27/2021    8:34 AM 12/17/2019   10:43 AM 12/07/2018   11:07 AM  Fall Risk   Falls in the past year? 0 0 0  0  Number falls in past yr: 0 0 0 0 0  Injury with Fall? 0 0 0 0 0  Risk for fall due to : No Fall Risks No Fall Risks     Follow up Falls prevention discussed;Education provided;Falls evaluation completed Falls prevention discussed;Education provided   Falls evaluation completed     Functional Status Survey: Is the patient deaf or have difficulty hearing?: No Does the patient have difficulty seeing, even when wearing glasses/contacts?: No Does the patient have difficulty concentrating, remembering, or making decisions?: No Does the patient have difficulty walking or climbing stairs?: No Does the patient have difficulty dressing or bathing?: No Does the patient have difficulty doing errands alone such as visiting a doctor's office or shopping?: No   Assessment & Plan  Problem List Items Addressed This Visit   None Visit  Diagnoses     Annual physical exam    -  Primary   Relevant Orders   TSH   Hemoglobin A1c   Lipid panel   CBC with Differential/Platelet   COMPLETE METABOLIC PANEL WITH GFR       -USPSTF grade A and B recommendations reviewed with patient; age-appropriate recommendations, preventive care, screening tests, etc discussed and encouraged; healthy living encouraged; see AVS for patient education given to patient -Discussed importance of 150 minutes of physical activity weekly, eat two servings  of fish weekly, eat one serving of tree nuts ( cashews, pistachios, pecans, almonds.Marland Kitchen) every other day, eat 6 servings of fruit/vegetables daily and drink plenty of water and avoid sweet beverages.   -Reviewed Health Maintenance: Yes.    Return in about 1 year (around 02/29/2024) for Annual Physical.   I, Willma Obando E Wing Schoch, PA-C, have reviewed all documentation for this visit. The documentation on 03/01/23 for the exam, diagnosis, procedures, and orders are all accurate and complete.   Jacquelin Hawking, MHS, PA-C [facility]

## 2023-03-01 NOTE — Patient Instructions (Signed)
Please try to wear compression stockings. Use your shoe size and then measure around the widest part of your calf to get the appropriate size stocking I typically recommend 15-25 mmHg compression force for daily wear. Please put these on first thing in the morning and wear until the evening time.  These should not be painful or cut into your legs but they may be pretty snug. If they cause pain, please take them off.  I recommend getting these from a medical supply store or a website called [employer]  I recommend using a ear drop called Debrox to help with your ear wax. Usually using as directed for about a week will help with ear wax mobility and reduce clogged ears.  It was nice to meet you and I appreciate the opportunity to be involved in your care If you were satisfied with the care you received from me, I would greatly appreciate you saying so in the after-visit survey that is sent out following our visit.

## 2023-03-02 LAB — CBC WITH DIFFERENTIAL/PLATELET
Absolute Lymphocytes: 2330 {cells}/uL (ref 850–3900)
Absolute Monocytes: 665 {cells}/uL (ref 200–950)
Basophils Absolute: 43 {cells}/uL (ref 0–200)
Basophils Relative: 0.7 %
Eosinophils Absolute: 153 {cells}/uL (ref 15–500)
Eosinophils Relative: 2.5 %
HCT: 41.3 % (ref 35.0–45.0)
Hemoglobin: 13.8 g/dL (ref 11.7–15.5)
MCH: 31.9 pg (ref 27.0–33.0)
MCHC: 33.4 g/dL (ref 32.0–36.0)
MCV: 95.6 fL (ref 80.0–100.0)
MPV: 11.3 fL (ref 7.5–12.5)
Monocytes Relative: 10.9 %
Neutro Abs: 2910 {cells}/uL (ref 1500–7800)
Neutrophils Relative %: 47.7 %
Platelets: 325 10*3/uL (ref 140–400)
RBC: 4.32 10*6/uL (ref 3.80–5.10)
RDW: 12.2 % (ref 11.0–15.0)
Total Lymphocyte: 38.2 %
WBC: 6.1 10*3/uL (ref 3.8–10.8)

## 2023-03-02 LAB — LIPID PANEL
Cholesterol: 251 mg/dL — ABNORMAL HIGH (ref <200)
HDL: 76 mg/dL (ref >=50)
LDL Cholesterol (Calc): 159 mg/dL — ABNORMAL HIGH
Non-HDL Cholesterol (Calc): 175 mg/dL — ABNORMAL HIGH (ref <130)
Total CHOL/HDL Ratio: 3.3 (calc) (ref <5.0)
Triglycerides: 69 mg/dL (ref <150)

## 2023-03-02 LAB — COMPLETE METABOLIC PANEL WITH GFR
AG Ratio: 1.3 (calc) (ref 1.0–2.5)
ALT: 15 U/L (ref 6–29)
AST: 21 U/L (ref 10–35)
Albumin: 4.7 g/dL (ref 3.6–5.1)
Alkaline phosphatase (APISO): 89 U/L (ref 37–153)
BUN: 13 mg/dL (ref 7–25)
CO2: 29 mmol/L (ref 20–32)
Calcium: 10.7 mg/dL — ABNORMAL HIGH (ref 8.6–10.4)
Chloride: 102 mmol/L (ref 98–110)
Creat: 0.99 mg/dL (ref 0.50–1.05)
Globulin: 3.5 g/dL (ref 1.9–3.7)
Glucose, Bld: 89 mg/dL (ref 65–99)
Potassium: 4.8 mmol/L (ref 3.5–5.3)
Sodium: 137 mmol/L (ref 135–146)
Total Bilirubin: 0.7 mg/dL (ref 0.2–1.2)
Total Protein: 8.2 g/dL — ABNORMAL HIGH (ref 6.1–8.1)
eGFR: 64 mL/min/{1.73_m2} (ref >=60)

## 2023-03-02 LAB — TSH: TSH: 1.18 m[IU]/L (ref 0.40–4.50)

## 2023-03-02 LAB — HEMOGLOBIN A1C
Hgb A1c MFr Bld: 5.8 %{Hb} — ABNORMAL HIGH (ref <5.7)
Mean Plasma Glucose: 120 mg/dL
eAG (mmol/L): 6.6 mmol/L

## 2023-03-04 NOTE — Progress Notes (Signed)
Your labs are back Your A1c was 5.8% which is in the prediabetic range. No medications are indicated at this time but I do recommend reducing your sugar and carb intake and making sure you are exercising regularly Your cholesterol is elevated and appears to be increased from previous labs.  I recommend a trial of diet and exercise to see if we can control this without starting medication.  This would include reducing saturated fats in your daily diet and increasing "good" fats such as those from olive oil, tree nuts, avocado, etc.  I also recommend increasing your weekly exercise to include at least 150 minutes of moderate intensity physical activity.  If these measures do not improve your cholesterol over the next 6 months I recommend starting a medication to manage your cholesterol Your liver and kidney function are in normal range.  Your electrolytes are overall normal with the exception of your calcium which is mildly elevated.  If you are taking a calcium supplement I recommend discontinuing that for now.  We will need to monitor this over the next few months to make sure that this is not continuing to remain high Your thyroid testing was normal Your CBC is overall normal, no signs of anemia Please let us know if you have further questions or concerns

## 2024-02-15 ENCOUNTER — Other Ambulatory Visit: Payer: Self-pay | Admitting: Family Medicine

## 2024-02-15 DIAGNOSIS — Z1231 Encounter for screening mammogram for malignant neoplasm of breast: Secondary | ICD-10-CM

## 2024-03-07 ENCOUNTER — Encounter: Admitting: Family Medicine

## 2024-03-12 ENCOUNTER — Encounter: Payer: Self-pay | Admitting: Family Medicine

## 2024-03-12 ENCOUNTER — Other Ambulatory Visit (HOSPITAL_COMMUNITY)
Admission: RE | Admit: 2024-03-12 | Discharge: 2024-03-12 | Disposition: A | Discharge status: Home / Self Care | Source: Ambulatory Visit | Attending: Family Medicine | Admitting: Family Medicine

## 2024-03-12 ENCOUNTER — Ambulatory Visit (INDEPENDENT_AMBULATORY_CARE_PROVIDER_SITE_OTHER): Admitting: Family Medicine

## 2024-03-12 VITALS — BP 136/76 | HR 86 | Resp 16 | Ht 65.0 in | Wt 148.6 lb

## 2024-03-12 DIAGNOSIS — Z124 Encounter for screening for malignant neoplasm of cervix: Secondary | ICD-10-CM | POA: Insufficient documentation

## 2024-03-12 DIAGNOSIS — E785 Hyperlipidemia, unspecified: Secondary | ICD-10-CM

## 2024-03-12 DIAGNOSIS — Z01419 Encounter for gynecological examination (general) (routine) without abnormal findings: Secondary | ICD-10-CM | POA: Insufficient documentation

## 2024-03-12 DIAGNOSIS — R030 Elevated blood-pressure reading, without diagnosis of hypertension: Secondary | ICD-10-CM

## 2024-03-12 DIAGNOSIS — Z23 Encounter for immunization: Secondary | ICD-10-CM

## 2024-03-12 DIAGNOSIS — Z01411 Encounter for gynecological examination (general) (routine) with abnormal findings: Secondary | ICD-10-CM | POA: Diagnosis not present

## 2024-03-12 DIAGNOSIS — R7303 Prediabetes: Secondary | ICD-10-CM

## 2024-03-12 DIAGNOSIS — E559 Vitamin D deficiency, unspecified: Secondary | ICD-10-CM | POA: Diagnosis not present

## 2024-03-12 NOTE — Patient Instructions (Signed)
 Preventive Care 83 Years and Older, Female Preventive care refers to lifestyle choices and visits with your health care provider that can promote health and wellness. Preventive care visits are also called wellness exams. What can I expect for my preventive care visit? Counseling Your health care provider may ask you questions about your: Medical history, including: Past medical problems. Family medical history. Pregnancy and menstrual history. History of falls. Current health, including: Memory and ability to understand (cognition). Emotional well-being. Home life and relationship well-being. Sexual activity and sexual health. Lifestyle, including: Alcohol, nicotine or tobacco, and drug use. Access to firearms. Diet, exercise, and sleep habits. Work and work Astronomer. Sunscreen use. Safety issues such as seatbelt and bike helmet use. Physical exam Your health care provider will check your: Height and weight. These may be used to calculate your BMI (body mass index). BMI is a measurement that tells if you are at a healthy weight. Waist circumference. This measures the distance around your waistline. This measurement also tells if you are at a healthy weight and may help predict your risk of certain diseases, such as type 2 diabetes and high blood pressure. Heart rate and blood pressure. Body temperature. Skin for abnormal spots. What immunizations do I need?  Vaccines are usually given at various ages, according to a schedule. Your health care provider will recommend vaccines for you based on your age, medical history, and lifestyle or other factors, such as travel or where you work. What tests do I need? Screening Your health care provider may recommend screening tests for certain conditions. This may include: Lipid and cholesterol levels. Hepatitis C test. Hepatitis B test. HIV (human immunodeficiency virus) test. STI (sexually transmitted infection) testing, if you are at  risk. Lung cancer screening. Colorectal cancer screening. Diabetes screening. This is done by checking your blood sugar (glucose) after you have not eaten for a while (fasting). Mammogram. Talk with your health care provider about how often you should have regular mammograms. BRCA-related cancer screening. This may be done if you have a family history of breast, ovarian, tubal, or peritoneal cancers. Bone density scan. This is done to screen for osteoporosis. Talk with your health care provider about your test results, treatment options, and if necessary, the need for more tests. Follow these instructions at home: Eating and drinking  Eat a diet that includes fresh fruits and vegetables, whole grains, lean protein, and low-fat dairy products. Limit your intake of foods with high amounts of sugar, saturated fats, and salt. Take vitamin and mineral supplements as recommended by your health care provider. Do not drink alcohol if your health care provider tells you not to drink. If you drink alcohol: Limit how much you have to 0-1 drink a day. Know how much alcohol is in your drink. In the [place] one drink equals one 12 oz bottle of beer (355 mL), one 5 oz glass of wine (148 mL), or one 1 oz glass of hard liquor (44 mL). Lifestyle Brush your teeth every morning and night with fluoride toothpaste. Floss one time each day. Exercise for at least 30 minutes 5 or more days each week. Do not use any products that contain nicotine or tobacco. These products include cigarettes, chewing tobacco, and vaping devices, such as e-cigarettes. If you need help quitting, ask your health care provider. Do not use drugs. If you are sexually active, practice safe sex. Use a condom or other form of protection in order to prevent STIs. Take aspirin only as told by  your health care provider. Make sure that you understand how much to take and what form to take. Work with your health care provider to find out whether it  is safe and beneficial for you to take aspirin daily. Ask your health care provider if you need to take a cholesterol-lowering medicine (statin). Find healthy ways to manage stress, such as: Meditation, yoga, or listening to music. Journaling. Talking to a trusted person. Spending time with friends and family. Minimize exposure to UV radiation to reduce your risk of skin cancer. Safety Always wear your seat belt while driving or riding in a vehicle. Do not drive: If you have been drinking alcohol. Do not ride with someone who has been drinking. When you are tired or distracted. While texting. If you have been using any mind-altering substances or drugs. Wear a helmet and other protective equipment during sports activities. If you have firearms in your house, make sure you follow all gun safety procedures. What's next? Visit your health care provider once a year for an annual wellness visit. Ask your health care provider how often you should have your eyes and teeth checked. Stay up to date on all vaccines. This information is not intended to replace advice given to you by your health care provider. Make sure you discuss any questions you have with your health care provider. Document Revised: 10/08/2020 Document Reviewed: 10/08/2020 Elsevier Patient Education  2024 [employer].

## 2024-03-12 NOTE — Progress Notes (Signed)
 Name: Dawn Holt   MRN: [id number]    DOB: Jan 08, 1961   Date:03/12/2024       Progress Note  Subjective  Chief Complaint  Chief Complaint  Patient presents with   Annual Exam    HPI  Patient presents for annual CPE.  Diet: balanced diet , lots of fruit and vegetables and lean meat  Exercise: works out 5 days a week, goes to the gym  Last Eye Exam: completed Last Dental Exam: completed  Flowsheet Row Office Visit from 03/12/2024 in [facility]  AUDIT-C Score 4   Depression: Phq 9 is  negative    03/12/2024    9:51 AM 03/01/2023   11:14 AM 02/01/2022    9:24 AM 01/27/2021    8:34 AM 12/17/2019   10:44 AM  Depression screen PHQ 2/9  Decreased Interest 0 0 0 0 0  Down, Depressed, Hopeless 0 0 0 0 0  PHQ - 2 Score 0 0 0 0 0  Altered sleeping  0 0 0 2  Tired, decreased energy  0 0 0 0  Change in appetite  0 0 0 0  Feeling bad or failure about yourself   0 0 0 0  Trouble concentrating  0 0 0 0  Moving slowly or fidgety/restless  0 0 0 0  Suicidal thoughts  0 0 0 0  PHQ-9 Score  0  0  0  2   Difficult doing work/chores  Not difficult at all Not difficult at all Not difficult at all Not difficult at all     Data saved with a previous flowsheet row definition   Hypertension: BP Readings from Last 3 Encounters:  03/12/24 (!) 146/80  03/01/23 136/84  02/01/22 118/82   Obesity: Wt Readings from Last 3 Encounters:  03/12/24 148 lb 9.6 oz (67.4 kg)  03/01/23 151 lb 8 oz (68.7 kg)  02/01/22 146 lb 4.8 oz (66.4 kg)   BMI Readings from Last 3 Encounters:  03/12/24 24.73 kg/m  03/01/23 25.21 kg/m  02/01/22 24.35 kg/m     Vaccines: reviewed with the patient.   Hep C Screening: completed STD testing and prevention (HIV/chl/gon/syphilis): Not interested  Intimate partner violence: negative screen  Sexual History :one partner, married  Menstrual History/LMP/Abnormal Bleeding: post menopausal  Discussed importance of follow up if any  post-menopausal bleeding: yes  Incontinence Symptoms: negative for symptoms   Breast cancer:  - Last Mammogram: scheduled  - BRCA gene screening: she had colon cancer, negative BRCA  Osteoporosis Prevention : Discussed high calcium and vitamin D  supplementation, weight bearing exercises Bone density :no   Cervical cancer screening: performing today  Skin cancer: Discussed monitoring for atypical lesions  Colorectal cancer: up to date    Lung cancer:  Low Dose CT Chest recommended if Age 77-80 years, 20 pack-year currently smoking OR have quit w/in 15years. Patient does not qualify for screen   ECG: next visit   Advanced Care Planning: A voluntary discussion about advance care planning including the explanation and discussion of advance directives.  Discussed health care proxy and Living will, and the patient was able to identify a health care proxy as husband  .  Patient does not have a living will and power of attorney of health care   Patient Active Problem List   Diagnosis Date Noted   Moderate mixed hyperlipidemia not requiring statin therapy 02/01/2022   Essential hypertension, benign 10/21/2017   History of colon cancer 07/06/2014  Past Surgical History:  Procedure Laterality Date   COLON SURGERY  2005   colon resection   COLONOSCOPY  2005,2008,2012   COLONOSCOPY WITH PROPOFOL  N/A 11/05/2015   Procedure: COLONOSCOPY WITH PROPOFOL ;  Surgeon: Reyes LELON Cota, MD;  Location: [facility] ENDOSCOPY;  Service: Endoscopy;  Laterality: N/A;   COLONOSCOPY WITH PROPOFOL  N/A 01/14/2021   Procedure: COLONOSCOPY WITH PROPOFOL ;  Surgeon: Cota Reyes LELON, MD;  Location: [facility] ENDOSCOPY;  Service: Endoscopy;  Laterality: N/A;    Family History  Problem Relation Age of Onset   Colon polyps Mother    Colon polyps Maternal Aunt 52       with cancer   Colon polyps Brother 72   Colon polyps Maternal Aunt 70       with cancer   Breast cancer Cousin        pat cousin   Breast cancer  Maternal Aunt 53    Social History   Socioeconomic History   Marital status: Married    Spouse name: physicist, medical   Number of children: 2   Years of education: Not on file   Highest education level: Not on file  Occupational History   Not on file  Tobacco Use   Smoking status: Never   Smokeless tobacco: Never  Vaping Use   Vaping status: Never Used  Substance and Sexual Activity   Alcohol use: Yes    Alcohol/week: 3.0 standard drinks of alcohol    Types: 3 Standard drinks or equivalent per week    Comment: occasionally   Drug use: No   Sexual activity: Yes    Partners: Male    Birth control/protection: Post-menopausal  Other Topics Concern   Not on file  Social History Narrative   Not on file   Social Drivers of Health   Financial Resource Strain: Low Risk  (03/12/2024)   Overall Financial Resource Strain (CARDIA)    Difficulty of Paying Living Expenses: Not hard at all  Food Insecurity: No Food Insecurity (03/12/2024)   Hunger Vital Sign    Worried About Running Out of Food in the Last Year: Never true    Ran Out of Food in the Last Year: Never true  Transportation Needs: No Transportation Needs (03/12/2024)   PRAPARE - Administrator, Civil Service (Medical): No    Lack of Transportation (Non-Medical): No  Physical Activity: Sufficiently Active (03/12/2024)   Exercise Vital Sign    Days of Exercise per Week: 5 days    Minutes of Exercise per Session: 60 min  Stress: No Stress Concern Present (03/12/2024)   [employer] of Occupational Health - Occupational Stress Questionnaire    Feeling of Stress: Not at all  Social Connections: Socially Integrated (03/12/2024)   Social Connection and Isolation Panel    Frequency of Communication with Friends and Family: More than three times a week    Frequency of Social Gatherings with Friends and Family: More than three times a week    Attends Religious Services: More than 4 times per year    Active Member of  [employer] or Organizations: Yes    Attends Banker Meetings: More than 4 times per year    Marital Status: Married  Catering Manager Violence: Not At Risk (03/12/2024)   Humiliation, Afraid, Rape, and Kick questionnaire    Fear of Current or Ex-Partner: No    Emotionally Abused: No    Physically Abused: No    Sexually Abused: No    No current outpatient medications  on file.  No Known Allergies   ROS  Constitutional: Negative for fever or weight change.  Respiratory: Negative for cough and shortness of breath.   Cardiovascular: Negative for chest pain or palpitations.  Gastrointestinal: Negative for abdominal pain, no bowel changes.  Musculoskeletal: Negative for gait problem or joint swelling.  Skin: Negative for rash.  Neurological: Negative for dizziness or headache.  No other specific complaints in a complete review of systems (except as listed in HPI above).   Objective  Vitals:   03/12/24 0957  BP: (!) 146/80  Pulse: 86  Resp: 16  SpO2: 99%  Weight: 148 lb 9.6 oz (67.4 kg)  Height: 5' 5 (1.651 m)    Body mass index is 24.73 kg/m.  Physical Exam  Constitutional: Patient appears well-developed and well-nourished. No distress.  HENT: Head: Normocephalic and atraumatic. Ears: B TMs ok, no erythema or effusion; Nose: Nose normal. Mouth/Throat: Oropharynx is clear and moist. No oropharyngeal exudate.  Eyes: Conjunctivae and EOM are normal. Pupils are equal, round, and reactive to light. No scleral icterus.  Neck: Normal range of motion. Neck supple. No JVD present. No thyromegaly present.  Cardiovascular: Normal rate, regular rhythm and normal heart sounds.  No murmur heard. No BLE edema. Pulmonary/Chest: Effort normal and breath sounds normal. No respiratory distress. Abdominal: Soft. Bowel sounds are normal, no distension. There is no tenderness. no masses Breast: thick breast tissue on inner upper quadrants - per patient it is normal , no nipple  discharge or rashes FEMALE GENITALIA:  External genitalia normal External urethra normal Vaginal vault normal without discharge or lesions Cervix normal without discharge or lesions Bimanual exam normal without masses RECTAL: not done  Musculoskeletal: Normal range of motion, no joint effusions. No gross deformities Neurological: he is alert and oriented to person, place, and time. No cranial nerve deficit. Coordination, balance, strength, speech and gait are normal.  Skin: Skin is warm and dry. No rash noted. No erythema.  Psychiatric: Patient has a normal mood and affect. behavior is normal. Judgment and thought content normal.     Assessment & Plan  1. Well woman exam (Primary)  - Cytology - PAP  2. Screening for malignant neoplasm of cervix  - Cytology - PAP  3. Vitamin D  deficiency  Vitamin D    4. Dyslipidemia  - Lipid panel  5. Elevated blood pressure reading without diagnosis of hypertension  - CBC with Differential/Platelet - Comprehensive metabolic panel with GFR  6. Need for pneumococcal 20-valent conjugate vaccination  - Pneumococcal conjugate vaccine 20-valent (Prevnar 20)  7. Pre-diabetes  - Hemoglobin A1c    -USPSTF grade A and B recommendations reviewed with patient; age-appropriate recommendations, preventive care, screening tests, etc discussed and encouraged; healthy living encouraged; see AVS for patient education given to patient -Discussed importance of 150 minutes of physical activity weekly, eat two servings of fish weekly, eat one serving of tree nuts ( cashews, pistachios, pecans, almonds.SABRA) every other day, eat 6 servings of fruit/vegetables daily and drink plenty of water and avoid sweet beverages.   -Reviewed Health Maintenance: Yes.

## 2024-03-13 ENCOUNTER — Ambulatory Visit: Payer: Self-pay | Admitting: Family Medicine

## 2024-03-13 LAB — COMPREHENSIVE METABOLIC PANEL WITH GFR
AG Ratio: 1.4 (calc) (ref 1.0–2.5)
ALT: 11 U/L (ref 6–29)
AST: 21 U/L (ref 10–35)
Albumin: 4.5 g/dL (ref 3.6–5.1)
Alkaline phosphatase (APISO): 80 U/L (ref 37–153)
BUN: 12 mg/dL (ref 7–25)
CO2: 26 mmol/L (ref 20–32)
Calcium: 9.7 mg/dL (ref 8.6–10.4)
Chloride: 104 mmol/L (ref 98–110)
Creat: 0.96 mg/dL (ref 0.50–1.05)
Globulin: 3.3 g/dL (ref 1.9–3.7)
Glucose, Bld: 91 mg/dL (ref 65–99)
Potassium: 4.3 mmol/L (ref 3.5–5.3)
Sodium: 141 mmol/L (ref 135–146)
Total Bilirubin: 1 mg/dL (ref 0.2–1.2)
Total Protein: 7.8 g/dL (ref 6.1–8.1)
eGFR: 66 mL/min/1.73m2 (ref >=60)

## 2024-03-13 LAB — CBC WITH DIFFERENTIAL/PLATELET
Absolute Lymphocytes: 2098 {cells}/uL (ref 850–3900)
Absolute Monocytes: 445 {cells}/uL (ref 200–950)
Basophils Absolute: 49 {cells}/uL (ref 0–200)
Basophils Relative: 0.8 %
Eosinophils Absolute: 159 {cells}/uL (ref 15–500)
Eosinophils Relative: 2.6 %
HCT: 38.7 % (ref 35.0–45.0)
Hemoglobin: 12.7 g/dL (ref 11.7–15.5)
MCH: 31.8 pg (ref 27.0–33.0)
MCHC: 32.8 g/dL (ref 32.0–36.0)
MCV: 96.8 fL (ref 80.0–100.0)
MPV: 10.6 fL (ref 7.5–12.5)
Monocytes Relative: 7.3 %
Neutro Abs: 3349 {cells}/uL (ref 1500–7800)
Neutrophils Relative %: 54.9 %
Platelets: 313 Thousand/uL (ref 140–400)
RBC: 4 Million/uL (ref 3.80–5.10)
RDW: 12.3 % (ref 11.0–15.0)
Total Lymphocyte: 34.4 %
WBC: 6.1 Thousand/uL (ref 3.8–10.8)

## 2024-03-13 LAB — LIPID PANEL
Cholesterol: 229 mg/dL — ABNORMAL HIGH (ref <200)
HDL: 70 mg/dL (ref >=50)
LDL Cholesterol (Calc): 141 mg/dL — ABNORMAL HIGH
Non-HDL Cholesterol (Calc): 159 mg/dL — ABNORMAL HIGH (ref <130)
Total CHOL/HDL Ratio: 3.3 (calc) (ref <5.0)
Triglycerides: 81 mg/dL (ref <150)

## 2024-03-13 LAB — CYTOLOGY - PAP
Comment: NEGATIVE
Diagnosis: NEGATIVE
High risk HPV: NEGATIVE

## 2024-03-13 LAB — HEMOGLOBIN A1C
Hgb A1c MFr Bld: 5.8 % — ABNORMAL HIGH (ref <5.7)
Mean Plasma Glucose: 120 mg/dL
eAG (mmol/L): 6.6 mmol/L

## 2024-03-13 LAB — VITAMIN D 25 HYDROXY (VIT D DEFICIENCY, FRACTURES): Vit D, 25-Hydroxy: 34 ng/mL (ref 30–100)

## 2024-03-15 ENCOUNTER — Ambulatory Visit
Admission: RE | Admit: 2024-03-15 | Discharge: 2024-03-15 | Disposition: A | Discharge status: Home / Self Care | Source: Ambulatory Visit | Attending: Family Medicine | Admitting: Family Medicine

## 2024-03-15 DIAGNOSIS — Z1231 Encounter for screening mammogram for malignant neoplasm of breast: Secondary | ICD-10-CM | POA: Diagnosis present

## 2024-04-12 ENCOUNTER — Ambulatory Visit: Admitting: Family Medicine

## 2025-03-13 ENCOUNTER — Encounter: Admitting: Nurse Practitioner

## 2025-03-13 ENCOUNTER — Encounter: Admitting: Family Medicine
